# Patient Record
Sex: Female | Born: 1975 | Race: Black or African American | Hispanic: No | Marital: Married | Smoking: Never smoker
Health system: Southern US, Community
[De-identification: ages and names within clinical notes are randomized; demographics above are authoritative.]

## PROBLEM LIST (undated history)

## (undated) DIAGNOSIS — I10 Essential (primary) hypertension: Secondary | ICD-10-CM

## (undated) DIAGNOSIS — F32A Depression, unspecified: Secondary | ICD-10-CM

## (undated) DIAGNOSIS — D219 Benign neoplasm of connective and other soft tissue, unspecified: Secondary | ICD-10-CM

## (undated) DIAGNOSIS — Z531 Procedure and treatment not carried out because of patient's decision for reasons of belief and group pressure: Secondary | ICD-10-CM

## (undated) DIAGNOSIS — F329 Major depressive disorder, single episode, unspecified: Secondary | ICD-10-CM

## (undated) DIAGNOSIS — G4730 Sleep apnea, unspecified: Secondary | ICD-10-CM

## (undated) DIAGNOSIS — T7840XA Allergy, unspecified, initial encounter: Secondary | ICD-10-CM

## (undated) HISTORY — PX: MYOMECTOMY: SHX85

## (undated) HISTORY — PX: HERNIA REPAIR: SHX51

## (undated) HISTORY — DX: Depression, unspecified: F32.A

## (undated) HISTORY — PX: WISDOM TOOTH EXTRACTION: SHX21

## (undated) HISTORY — DX: Major depressive disorder, single episode, unspecified: F32.9

## (undated) HISTORY — PX: ABLATION: SHX5711

## (undated) HISTORY — PX: ABDOMINAL HYSTERECTOMY: SHX81

## (undated) HISTORY — DX: Allergy, unspecified, initial encounter: T78.40XA

## (undated) HISTORY — DX: Benign neoplasm of connective and other soft tissue, unspecified: D21.9

---

## 1997-12-25 ENCOUNTER — Other Ambulatory Visit: Admission: RE | Admit: 1997-12-25 | Discharge: 1997-12-25 | Payer: Self-pay | Admitting: Obstetrics and Gynecology

## 1998-05-26 ENCOUNTER — Encounter: Payer: Self-pay | Admitting: Gynecology

## 1998-05-26 ENCOUNTER — Ambulatory Visit (HOSPITAL_COMMUNITY): Admission: RE | Admit: 1998-05-26 | Discharge: 1998-05-26 | Payer: Self-pay | Admitting: Gynecology

## 1998-09-24 ENCOUNTER — Ambulatory Visit (HOSPITAL_COMMUNITY): Admission: RE | Admit: 1998-09-24 | Discharge: 1998-09-24 | Payer: Self-pay | Admitting: Gynecology

## 1998-09-24 ENCOUNTER — Encounter: Payer: Self-pay | Admitting: Gynecology

## 1999-03-04 ENCOUNTER — Other Ambulatory Visit: Admission: RE | Admit: 1999-03-04 | Discharge: 1999-03-04 | Payer: Self-pay | Admitting: Gynecology

## 2003-08-07 ENCOUNTER — Other Ambulatory Visit: Admission: RE | Admit: 2003-08-07 | Discharge: 2003-08-07 | Payer: Self-pay | Admitting: Gynecology

## 2004-08-08 ENCOUNTER — Other Ambulatory Visit: Admission: RE | Admit: 2004-08-08 | Discharge: 2004-08-08 | Payer: Self-pay | Admitting: Gynecology

## 2005-08-26 ENCOUNTER — Other Ambulatory Visit: Admission: RE | Admit: 2005-08-26 | Discharge: 2005-08-26 | Payer: Self-pay | Admitting: Gynecology

## 2006-12-22 ENCOUNTER — Other Ambulatory Visit: Admission: RE | Admit: 2006-12-22 | Discharge: 2006-12-22 | Payer: Self-pay | Admitting: Gynecology

## 2007-01-12 ENCOUNTER — Ambulatory Visit (HOSPITAL_COMMUNITY): Admission: RE | Admit: 2007-01-12 | Discharge: 2007-01-12 | Payer: Self-pay | Admitting: Gynecology

## 2008-05-23 ENCOUNTER — Other Ambulatory Visit: Admission: RE | Admit: 2008-05-23 | Discharge: 2008-05-23 | Payer: Self-pay | Admitting: Gynecology

## 2008-11-24 ENCOUNTER — Emergency Department (HOSPITAL_COMMUNITY): Admission: EM | Admit: 2008-11-24 | Discharge: 2008-11-24 | Payer: Self-pay | Admitting: Emergency Medicine

## 2009-08-17 ENCOUNTER — Emergency Department (HOSPITAL_COMMUNITY): Admission: EM | Admit: 2009-08-17 | Discharge: 2009-08-17 | Payer: Self-pay | Admitting: Emergency Medicine

## 2010-09-21 ENCOUNTER — Encounter
Admission: RE | Admit: 2010-09-21 | Discharge: 2010-09-21 | Payer: Self-pay | Source: Home / Self Care | Attending: Family Medicine | Admitting: Family Medicine

## 2010-10-09 ENCOUNTER — Encounter: Admit: 2010-10-09 | Payer: Self-pay | Admitting: Family Medicine

## 2011-04-17 ENCOUNTER — Other Ambulatory Visit: Payer: Self-pay | Admitting: Obstetrics & Gynecology

## 2011-04-17 ENCOUNTER — Ambulatory Visit
Admission: RE | Admit: 2011-04-17 | Discharge: 2011-04-17 | Disposition: A | Discharge status: Home / Self Care | Payer: 59 | Source: Ambulatory Visit | Attending: Obstetrics & Gynecology | Admitting: Obstetrics & Gynecology

## 2011-04-17 DIAGNOSIS — N920 Excessive and frequent menstruation with regular cycle: Secondary | ICD-10-CM

## 2011-04-17 DIAGNOSIS — D259 Leiomyoma of uterus, unspecified: Secondary | ICD-10-CM

## 2011-04-17 MED ORDER — GADOBENATE DIMEGLUMINE 529 MG/ML IV SOLN
16.0000 mL | Freq: Once | INTRAVENOUS | Status: AC | PRN
  Administered 2011-04-17: 16 mL via INTRAVENOUS

## 2011-04-21 ENCOUNTER — Other Ambulatory Visit: Payer: Self-pay | Admitting: Obstetrics & Gynecology

## 2011-04-21 ENCOUNTER — Encounter (HOSPITAL_COMMUNITY): Payer: 59

## 2011-04-21 LAB — CBC
HCT: 37.8 % (ref 36.0–46.0)
Hemoglobin: 12.2 g/dL (ref 12.0–15.0)
MCH: 28.8 pg (ref 26.0–34.0)
MCHC: 32.3 g/dL (ref 30.0–36.0)
RDW: 13.5 % (ref 11.5–15.5)

## 2011-04-23 HISTORY — PX: OTHER SURGICAL HISTORY: SHX169

## 2011-04-25 ENCOUNTER — Other Ambulatory Visit: Payer: Self-pay | Admitting: Obstetrics & Gynecology

## 2011-04-25 ENCOUNTER — Ambulatory Visit (HOSPITAL_COMMUNITY)
Admission: RE | Admit: 2011-04-25 | Discharge: 2011-04-26 | Disposition: A | Discharge status: Home / Self Care | Payer: 59 | Source: Ambulatory Visit | Attending: Obstetrics & Gynecology | Admitting: Obstetrics & Gynecology

## 2011-04-25 DIAGNOSIS — Z79899 Other long term (current) drug therapy: Secondary | ICD-10-CM | POA: Insufficient documentation

## 2011-04-25 DIAGNOSIS — Z01812 Encounter for preprocedural laboratory examination: Secondary | ICD-10-CM | POA: Insufficient documentation

## 2011-04-25 DIAGNOSIS — N949 Unspecified condition associated with female genital organs and menstrual cycle: Secondary | ICD-10-CM | POA: Insufficient documentation

## 2011-04-25 DIAGNOSIS — I10 Essential (primary) hypertension: Secondary | ICD-10-CM | POA: Insufficient documentation

## 2011-04-25 DIAGNOSIS — D259 Leiomyoma of uterus, unspecified: Secondary | ICD-10-CM | POA: Insufficient documentation

## 2011-04-25 DIAGNOSIS — N920 Excessive and frequent menstruation with regular cycle: Secondary | ICD-10-CM | POA: Insufficient documentation

## 2011-04-26 LAB — CBC
HCT: 24.8 % — ABNORMAL LOW (ref 36.0–46.0)
MCH: 29.4 pg (ref 26.0–34.0)
MCV: 88.9 fL (ref 78.0–100.0)
RBC: 2.79 MIL/uL — ABNORMAL LOW (ref 3.87–5.11)
WBC: 9.1 10*3/uL (ref 4.0–10.5)

## 2011-09-23 DIAGNOSIS — Z531 Procedure and treatment not carried out because of patient's decision for reasons of belief and group pressure: Secondary | ICD-10-CM

## 2011-09-23 DIAGNOSIS — IMO0001 Reserved for inherently not codable concepts without codable children: Secondary | ICD-10-CM

## 2011-09-23 DIAGNOSIS — D219 Benign neoplasm of connective and other soft tissue, unspecified: Secondary | ICD-10-CM

## 2011-09-23 HISTORY — DX: Benign neoplasm of connective and other soft tissue, unspecified: D21.9

## 2011-09-23 HISTORY — DX: Procedure and treatment not carried out because of patient's decision for reasons of belief and group pressure: Z53.1

## 2011-09-23 HISTORY — DX: Reserved for inherently not codable concepts without codable children: IMO0001

## 2012-03-23 ENCOUNTER — Encounter (HOSPITAL_COMMUNITY): Payer: Self-pay | Admitting: *Deleted

## 2012-04-05 ENCOUNTER — Encounter (HOSPITAL_COMMUNITY): Payer: Self-pay | Admitting: Pharmacist

## 2012-04-08 ENCOUNTER — Other Ambulatory Visit: Payer: Self-pay | Admitting: Obstetrics & Gynecology

## 2012-04-13 ENCOUNTER — Ambulatory Visit (HOSPITAL_COMMUNITY): Payer: 59 | Admitting: Anesthesiology

## 2012-04-13 ENCOUNTER — Encounter (HOSPITAL_COMMUNITY): Payer: Self-pay | Admitting: Anesthesiology

## 2012-04-13 ENCOUNTER — Ambulatory Visit (HOSPITAL_COMMUNITY)
Admission: RE | Admit: 2012-04-13 | Discharge: 2012-04-13 | Disposition: A | Discharge status: Home / Self Care | Payer: 59 | Source: Ambulatory Visit | Attending: Obstetrics & Gynecology | Admitting: Obstetrics & Gynecology

## 2012-04-13 ENCOUNTER — Encounter (HOSPITAL_COMMUNITY)
Admission: RE | Disposition: A | Discharge status: Home / Self Care | Payer: Self-pay | Source: Ambulatory Visit | Attending: Obstetrics & Gynecology

## 2012-04-13 ENCOUNTER — Encounter (HOSPITAL_COMMUNITY): Payer: Self-pay | Admitting: *Deleted

## 2012-04-13 DIAGNOSIS — N920 Excessive and frequent menstruation with regular cycle: Secondary | ICD-10-CM | POA: Insufficient documentation

## 2012-04-13 DIAGNOSIS — I10 Essential (primary) hypertension: Secondary | ICD-10-CM | POA: Insufficient documentation

## 2012-04-13 DIAGNOSIS — D259 Leiomyoma of uterus, unspecified: Secondary | ICD-10-CM | POA: Insufficient documentation

## 2012-04-13 HISTORY — DX: Benign neoplasm of connective and other soft tissue, unspecified: D21.9

## 2012-04-13 HISTORY — DX: Essential (primary) hypertension: I10

## 2012-04-13 HISTORY — DX: Procedure and treatment not carried out because of patient's decision for reasons of belief and group pressure: Z53.1

## 2012-04-13 LAB — CBC
HCT: 41.4 % (ref 36.0–46.0)
Hemoglobin: 13.4 g/dL (ref 12.0–15.0)
MCH: 29.5 pg (ref 26.0–34.0)
MCHC: 32.4 g/dL (ref 30.0–36.0)
MCV: 91 fL (ref 78.0–100.0)

## 2012-04-13 SURGERY — DILATATION & CURETTAGE/HYSTEROSCOPY WITH NOVASURE ABLATION
Anesthesia: General | Site: Uterus | Wound class: Clean Contaminated

## 2012-04-13 MED ORDER — FENTANYL CITRATE 0.05 MG/ML IJ SOLN
INTRAMUSCULAR | Status: AC
  Filled 2012-04-13: qty 2

## 2012-04-13 MED ORDER — DEXAMETHASONE SODIUM PHOSPHATE 10 MG/ML IJ SOLN
INTRAMUSCULAR | Status: AC
  Filled 2012-04-13: qty 1

## 2012-04-13 MED ORDER — KETOROLAC TROMETHAMINE 30 MG/ML IJ SOLN: 15.0000 mg | Freq: Once | INTRAMUSCULAR | Status: DC | PRN

## 2012-04-13 MED ORDER — MIDAZOLAM HCL 5 MG/5ML IJ SOLN
INTRAMUSCULAR | Status: DC | PRN
  Administered 2012-04-13: 2 mg via INTRAVENOUS

## 2012-04-13 MED ORDER — LIDOCAINE HCL (CARDIAC) 20 MG/ML IV SOLN
INTRAVENOUS | Status: AC
  Filled 2012-04-13: qty 5

## 2012-04-13 MED ORDER — CHLOROPROCAINE HCL 1 % IJ SOLN
INTRAMUSCULAR | Status: DC | PRN
  Administered 2012-04-13: 20 mL

## 2012-04-13 MED ORDER — SILVER NITRATE-POT NITRATE 75-25 % EX MISC
CUTANEOUS | Status: DC | PRN
  Administered 2012-04-13: 1

## 2012-04-13 MED ORDER — FENTANYL CITRATE 0.05 MG/ML IJ SOLN
INTRAMUSCULAR | Status: DC | PRN
  Administered 2012-04-13: 100 ug via INTRAVENOUS

## 2012-04-13 MED ORDER — LACTATED RINGERS IV SOLN
INTRAVENOUS | Status: DC | PRN
  Administered 2012-04-13: 3000 mL via INTRAUTERINE

## 2012-04-13 MED ORDER — KETOROLAC TROMETHAMINE 30 MG/ML IJ SOLN
INTRAMUSCULAR | Status: AC
  Filled 2012-04-13: qty 1

## 2012-04-13 MED ORDER — LIDOCAINE HCL (CARDIAC) 20 MG/ML IV SOLN
INTRAVENOUS | Status: DC | PRN
  Administered 2012-04-13: 30 mg via INTRAVENOUS

## 2012-04-13 MED ORDER — LACTATED RINGERS IV SOLN
INTRAVENOUS | Status: DC
  Administered 2012-04-13 (×2): via INTRAVENOUS

## 2012-04-13 MED ORDER — PROPOFOL 10 MG/ML IV EMUL
INTRAVENOUS | Status: DC | PRN
  Administered 2012-04-13: 160 mg via INTRAVENOUS
  Administered 2012-04-13: 40 mg via INTRAVENOUS

## 2012-04-13 MED ORDER — MIDAZOLAM HCL 2 MG/2ML IJ SOLN
INTRAMUSCULAR | Status: AC
  Filled 2012-04-13: qty 2

## 2012-04-13 MED ORDER — SILVER NITRATE-POT NITRATE 75-25 % EX MISC
CUTANEOUS | Status: AC
  Filled 2012-04-13: qty 2

## 2012-04-13 MED ORDER — ONDANSETRON HCL 4 MG/2ML IJ SOLN
INTRAMUSCULAR | Status: AC
  Filled 2012-04-13: qty 2

## 2012-04-13 MED ORDER — DEXAMETHASONE SODIUM PHOSPHATE 10 MG/ML IJ SOLN
INTRAMUSCULAR | Status: DC | PRN
  Administered 2012-04-13: 10 mg via INTRAVENOUS

## 2012-04-13 MED ORDER — KETOROLAC TROMETHAMINE 30 MG/ML IJ SOLN
INTRAMUSCULAR | Status: DC | PRN
  Administered 2012-04-13: 30 mg via INTRAVENOUS

## 2012-04-13 MED ORDER — ONDANSETRON HCL 4 MG/2ML IJ SOLN
INTRAMUSCULAR | Status: DC | PRN
  Administered 2012-04-13: 4 mg via INTRAVENOUS

## 2012-04-13 MED ORDER — FENTANYL CITRATE 0.05 MG/ML IJ SOLN
25.0000 ug | INTRAMUSCULAR | Status: DC | PRN
  Administered 2012-04-13 (×2): 50 ug via INTRAVENOUS

## 2012-04-13 MED ORDER — CHLOROPROCAINE HCL 1 % IJ SOLN
INTRAMUSCULAR | Status: AC
  Filled 2012-04-13: qty 30

## 2012-04-13 MED ORDER — OXYCODONE-ACETAMINOPHEN 7.5-325 MG PO TABS: 1.0000 | ORAL_TABLET | ORAL | Status: AC | PRN

## 2012-04-13 MED ORDER — PROPOFOL 10 MG/ML IV EMUL
INTRAVENOUS | Status: AC
  Filled 2012-04-13: qty 20

## 2012-04-13 SURGICAL SUPPLY — 12 items
ABLATOR ENDOMETRIAL BIPOLAR (ABLATOR) ×2 IMPLANT
CATH ROBINSON RED A/P 16FR (CATHETERS) ×2 IMPLANT
CLOTH BEACON ORANGE TIMEOUT ST (SAFETY) ×2 IMPLANT
CONTAINER PREFILL 10% NBF 60ML (FORM) IMPLANT
GLOVE BIO SURGEON STRL SZ 6.5 (GLOVE) ×4 IMPLANT
GLOVE BIOGEL PI IND STRL 7.0 (GLOVE) ×1 IMPLANT
GLOVE BIOGEL PI INDICATOR 7.0 (GLOVE) ×1
GOWN PREVENTION PLUS LG XLONG (DISPOSABLE) ×4 IMPLANT
GOWN STRL REIN XL XLG (GOWN DISPOSABLE) ×2 IMPLANT
PACK HYSTEROSCOPY LF (CUSTOM PROCEDURE TRAY) ×2 IMPLANT
TOWEL OR 17X24 6PK STRL BLUE (TOWEL DISPOSABLE) ×4 IMPLANT
WATER STERILE IRR 1000ML POUR (IV SOLUTION) ×2 IMPLANT

## 2012-04-13 NOTE — Discharge Summary (Signed)
  Physician Discharge Summary  Patient ID: Rachel Oconnor MRN: [id number] DOB/AGE: 1976-02-18 36 y.o.  Admit date: 04/13/2012 Discharge date: 04/13/2012  Admission Diagnoses: Resistent menometrorrhagia, Fibroids  Discharge Diagnoses: Resistent menometrorrhagia, Fibroids        Active Problems:  * No active hospital problems. *    Discharged Condition: good  Hospital Course:   Consults: None  Treatments: surgery: HSC, D+C, Novasure Endometrial Ablation  Disposition: 01-Home or Self Care   Medication List  As of 04/13/2012 10:48 AM   ASK your doctor about these medications         norethindrone 5 MG tablet   Commonly known as: AYGESTIN   Take 5 mg by mouth daily.           Follow-up Information    Follow up with Shaniqwa Horsman,MARIE-LYNE, MD in 3 weeks.   Contact information:   [address] [place] [address] [phone]          SignedGenia Del, MD 04/13/2012, 10:48 AM

## 2012-04-13 NOTE — Anesthesia Postprocedure Evaluation (Signed)
Anesthesia Post Note  Patient: Rachel Oconnor  Procedure(s) Performed: Procedure(s) (LRB): DILATATION & CURETTAGE/HYSTEROSCOPY WITH NOVASURE ABLATION (N/A)  Anesthesia type: General  Patient location: PACU  Post pain: Pain level controlled  Post assessment: Post-op Vital signs reviewed  Last Vitals:  Filed Vitals:   04/13/12 1145  BP: 154/84  Pulse: 59  Temp:   Resp: 16    Post vital signs: Reviewed  Level of consciousness: sedated  Complications: No apparent anesthesia complications

## 2012-04-13 NOTE — Anesthesia Preprocedure Evaluation (Signed)
Anesthesia Evaluation  Patient identified by MRN, date of birth, ID band Patient awake    Reviewed: Allergy & Precautions, H&P , NPO status , Patient's Chart, lab work & pertinent test results, reviewed documented beta blocker date and time   History of Anesthesia Complications Negative for: history of anesthetic complications  Airway Mallampati: I TM Distance: >3 FB Neck ROM: full    Dental  (+) Teeth Intact   Pulmonary neg pulmonary ROS,  breath sounds clear to auscultation  Pulmonary exam normal       Cardiovascular Exercise Tolerance: Good hypertension (patient states vitamins normally keep her BP under control, but hasn't taken them in 2 weeks.  BP 130s/90s in past year.), Rhythm:regular Rate:Normal     Neuro/Psych negative neurological ROS  negative psych ROS   GI/Hepatic negative GI ROS, Neg liver ROS,   Endo/Other  negative endocrine ROS  Renal/GU negative Renal ROS  Female GU complaint     Musculoskeletal   Abdominal   Peds  Hematology negative hematology ROS (+)   Anesthesia Other Findings   Reproductive/Obstetrics negative OB ROS                           Anesthesia Physical Anesthesia Plan  ASA: II  Anesthesia Plan: General LMA   Post-op Pain Management:    Induction:   Airway Management Planned:   Additional Equipment:   Intra-op Plan:   Post-operative Plan:   Informed Consent: I have reviewed the patients History and Physical, chart, labs and discussed the procedure including the risks, benefits and alternatives for the proposed anesthesia with the patient or authorized representative who has indicated his/her understanding and acceptance.   Dental Advisory Given  Plan Discussed with: CRNA and Surgeon  Anesthesia Plan Comments:         Anesthesia Quick Evaluation

## 2012-04-13 NOTE — Progress Notes (Signed)
Informed Dr. Rodman Pickle regarding pt's blood pressure.. DR. Rodman Pickle assessed patient and to proceed with surgery.

## 2012-04-13 NOTE — Transfer of Care (Signed)
Immediate Anesthesia Transfer of Care Note  Patient: Rachel Oconnor  Procedure(s) Performed: Procedure(s) (LRB): DILATATION & CURETTAGE/HYSTEROSCOPY WITH NOVASURE ABLATION (N/A)  Patient Location: PACU  Anesthesia Type: General  Level of Consciousness: awake  Airway & Oxygen Therapy: Patient Spontanous Breathing and Patient connected to nasal cannula oxygen  Post-op Assessment: Report given to PACU RN and Post -op Vital signs reviewed and stable  Post vital signs: stable  Complications: No apparent anesthesia complications

## 2012-04-13 NOTE — H&P (Signed)
Rachel Oconnor is an 36 y.o. female G0  RP:  Resistant menometrorrhagia  Pertinent Gynecological History: Menses: flow is excessive with use of many pads or tampons on heaviest days Bleeding: intermenstrual bleeding Contraception: Depo-Provera injections Blood transfusions: none Sexually transmitted diseases: no past history Previous GYN Procedures: Engineer, building services myomectomy Last mammogram: N/A  Last pap: normal   Menstrual History:  No LMP recorded.    Past Medical History  Diagnosis Date  . Fibroids 2013  . Refusal of blood transfusions as patient is Jehovah's Witness 2013  . Hypertension     no meds..pt states up last year    Past Surgical History  Procedure Date  . Davinci myomectomy 04/2011  . Wisdom tooth extraction   . Myomectomy     History reviewed. No pertinent family history.  Social History:  reports that she has never smoked. She has never used smokeless tobacco. She reports that she drinks alcohol. She reports that she does not use illicit drugs.  Allergies:  Allergies  Allergen Reactions  . Amoxicillin     Red blotches    Prescriptions prior to admission  Medication Sig Dispense Refill  . norethindrone (AYGESTIN) 5 MG tablet Take 5 mg by mouth daily.        Pulse 66, temperature 98.4 F (36.9 C), temperature source Oral, resp. rate 18, height 5\' 7"  (1.702 m), weight 84.823 kg (187 lb), SpO2 100.00%.  [place] Small IM myoma.  IU cavity wnl, at 4.1 mm.  Ovaries wnl.  Results for orders placed during the hospital encounter of 04/13/12 (from the past 24 hour(s))  PREGNANCY, URINE     Status: Normal   Collection Time   04/13/12  7:30 AM      Component Value Range   Preg Test, Ur NEGATIVE  NEGATIVE  CBC     Status: Normal   Collection Time   04/13/12  8:46 AM      Component Value Range   WBC 4.2  4.0 - 10.5 K/uL   RBC 4.55  3.87 - 5.11 MIL/uL   Hemoglobin 13.4  12.0 - 15.0 g/dL   HCT 16.1  09.6 - 04.5 %   MCV 91.0  78.0 - 100.0 fL   MCH 29.5  26.0  - 34.0 pg   MCHC 32.4  30.0 - 36.0 g/dL   RDW 40.9  81.1 - 91.4 %   Platelets 305  150 - 400 K/uL    No results found.  Assessment/Plan: Resistant menometrorrhagia for HSC, D+C and Novasure endometrial ablation.  Terrilyn Tyner,MARIE-LYNE 04/13/2012, 9:54 AM

## 2012-04-13 NOTE — Op Note (Signed)
04/13/2012  10:36 AM  PATIENT:  Rachel Oconnor  36 y.o. female  PRE-OPERATIVE DIAGNOSIS:  Resistant menometrorrhagia, Fibroids  POST-OPERATIVE DIAGNOSIS:  Resistant menometrorrhagia, Fibroids  PROCEDURE:  Procedure(s): DILATATION & CURETTAGE/HYSTEROSCOPY WITH NOVASURE ENDOMETRIAL ABLATION  SURGEON:  Surgeon(s): Genia Del, MD  ASSISTANTS: none   ANESTHESIA:   general  PROCEDURE: Under general anesthesia with the laryngeal mask, the patient is in lithotomy position. She is prepped with Betadine on the suprapubic, vulvar and vaginal areas.  The bladder is catheterized. She is draped as usual. The vaginal exam reveals an anteverted uterus, normal volume, no adnexal mass. The speculum was introduced in the vagina.  The anterior lip of the cervix is grasped with a tenaculum. A paracervical block is done with Nesacaine 1% at 4 and 8:00 using a total of 20 cc.  The cervix is dilated with Hegar dilators up to #19 without difficulty. The cervical length was 5 cm and the hysterometry was 11 cm for a cavity length of 6 cm. We proceed with a diagnostic hysteroscopy, the hysteroscope is introduced and the intrauterine cavity. The overall shape of the intrauterine cavity is normal. All the very small polyps are present close to the ostia. On the left side the ostium is well seen and a picture is taken. On the right side it is partially obscured by a polyp but a picture is of taken as well. Pictures were taken of the overall intrauterine cavity all the way to the cervix.  A systematic endometrial curettage is done with a sharp curet. The curettage is done on all endometrial walls. And the specimen was sent to pathology.  We then proceed to NovaSure endometrial ablation. The instrument was introduced and the intrauterine cavity. The cavity width is calculated at 2.8 cm. The antegrade T. Catheter is passed the. The endometrial ablation is done at a power of 92 for a running neonate and 11 seconds. The  instruments is then removed. The hysteroscope was introduced and the intrauterine cavity and a picture confirming good cauterization is taken. The hysteroscope was removed. The tenaculum was removed from the anterior lip of the cervix and a silver nitrate stick is used to control minor bleeding at that level. The speculum is then removed. The patient is brought to recovery room in good and stable status.  ESTIMATED BLOOD LOSS: 25 cc   Intake/Output Summary (Last 24 hours) at 04/13/12 1036 Last data filed at 04/13/12 1030  Gross per 24 hour  Intake   1000 ml  Output    125 ml  Net    875 ml     BLOOD ADMINISTERED:none   LOCAL MEDICATIONS USED:  Nesacaine 20 cc for Paracervical block  SPECIMEN:  Scraping:  Endometrial curettings  DISPOSITION OF SPECIMEN:  PATHOLOGY  COUNTS:  YES  PLAN OF CARE: Transfer to PACU    Genia Del MD  04/13/12 at 10:37 am

## 2012-06-21 ENCOUNTER — Ambulatory Visit: Payer: 59 | Attending: Family Medicine | Admitting: Physical Therapy

## 2012-06-21 DIAGNOSIS — M25559 Pain in unspecified hip: Secondary | ICD-10-CM | POA: Insufficient documentation

## 2012-06-21 DIAGNOSIS — M2569 Stiffness of other specified joint, not elsewhere classified: Secondary | ICD-10-CM | POA: Insufficient documentation

## 2012-06-21 DIAGNOSIS — M545 Low back pain, unspecified: Secondary | ICD-10-CM | POA: Insufficient documentation

## 2012-06-21 DIAGNOSIS — IMO0001 Reserved for inherently not codable concepts without codable children: Secondary | ICD-10-CM | POA: Insufficient documentation

## 2012-07-02 ENCOUNTER — Ambulatory Visit: Payer: 59 | Attending: Family Medicine | Admitting: Physical Therapy

## 2012-07-02 DIAGNOSIS — M25559 Pain in unspecified hip: Secondary | ICD-10-CM | POA: Insufficient documentation

## 2012-07-02 DIAGNOSIS — M545 Low back pain, unspecified: Secondary | ICD-10-CM | POA: Insufficient documentation

## 2012-07-02 DIAGNOSIS — IMO0001 Reserved for inherently not codable concepts without codable children: Secondary | ICD-10-CM | POA: Insufficient documentation

## 2012-07-02 DIAGNOSIS — M2569 Stiffness of other specified joint, not elsewhere classified: Secondary | ICD-10-CM | POA: Insufficient documentation

## 2012-07-09 ENCOUNTER — Ambulatory Visit: Payer: 59 | Admitting: Physical Therapy

## 2012-07-16 ENCOUNTER — Ambulatory Visit: Payer: 59 | Admitting: Physical Therapy

## 2012-07-23 ENCOUNTER — Ambulatory Visit: Payer: 59 | Admitting: Physical Therapy

## 2012-07-28 ENCOUNTER — Ambulatory Visit: Payer: 59 | Attending: Family Medicine | Admitting: Physical Therapy

## 2012-07-28 DIAGNOSIS — IMO0001 Reserved for inherently not codable concepts without codable children: Secondary | ICD-10-CM | POA: Insufficient documentation

## 2012-07-28 DIAGNOSIS — M545 Low back pain, unspecified: Secondary | ICD-10-CM | POA: Insufficient documentation

## 2012-07-28 DIAGNOSIS — M2569 Stiffness of other specified joint, not elsewhere classified: Secondary | ICD-10-CM | POA: Insufficient documentation

## 2012-07-28 DIAGNOSIS — M25559 Pain in unspecified hip: Secondary | ICD-10-CM | POA: Insufficient documentation

## 2012-07-30 ENCOUNTER — Ambulatory Visit: Payer: 59 | Admitting: Physical Therapy

## 2012-08-01 ENCOUNTER — Ambulatory Visit (INDEPENDENT_AMBULATORY_CARE_PROVIDER_SITE_OTHER): Payer: Commercial Managed Care - PPO | Admitting: Emergency Medicine

## 2012-08-01 VITALS — BP 134/92 | HR 96 | Temp 100.4°F | Resp 16 | Ht 68.0 in | Wt 187.6 lb

## 2012-08-01 DIAGNOSIS — Z23 Encounter for immunization: Secondary | ICD-10-CM

## 2012-08-01 DIAGNOSIS — S61219A Laceration without foreign body of unspecified finger without damage to nail, initial encounter: Secondary | ICD-10-CM

## 2012-08-01 DIAGNOSIS — S61209A Unspecified open wound of unspecified finger without damage to nail, initial encounter: Secondary | ICD-10-CM

## 2012-08-01 DIAGNOSIS — S335XXA Sprain of ligaments of lumbar spine, initial encounter: Secondary | ICD-10-CM

## 2012-08-01 MED ORDER — NAPROXEN SODIUM 550 MG PO TABS: 550.0000 mg | ORAL_TABLET | Freq: Two times a day (BID) | ORAL | Status: AC

## 2012-08-01 MED ORDER — HYDROCODONE-ACETAMINOPHEN 5-325 MG PO TABS: 1.0000 | ORAL_TABLET | ORAL | Status: AC | PRN

## 2012-08-01 NOTE — Progress Notes (Signed)
[facility] [address] [place] [address] [phone]  Date:  08/01/2012   Name:  Rachel Oconnor   DOB:  1976-05-14   MRN:  [id number]  PCP:  Gweneth Dimitri, MD    Chief Complaint: Back Pain and Laceration   History of Present Illness:  Rachel Oconnor is a 36 y.o. very pleasant female patient who presents with the following:  Awoke Friday with low back pain no history of injury or overuse.  Has radiation of pain in to posterior thighs and around the front of her abdomen.  Denies weakness in legs or paresthesias.  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  No prior history of low back injury or pain.   Cut her left index finger three days ago.    There is no problem list on file for this patient.   Past Medical History  Diagnosis Date  . Fibroids 2013  . Refusal of blood transfusions as patient is Jehovah's Witness 2013  . Hypertension     no meds..pt states up last year  . Allergy   . Depression     Past Surgical History  Procedure Date  . Davinci myomectomy 04/2011  . Wisdom tooth extraction   . Myomectomy   . Hernia repair   . Ablation     History  Substance Use Topics  . Smoking status: Never Smoker   . Smokeless tobacco: Never Used  . Alcohol Use: Yes     Comment: social    Family History  Problem Relation Age of Onset  . Hypertension Mother   . Cancer Father   . Hypertension Maternal Grandmother     Allergies  Allergen Reactions  . Amoxicillin     Red blotches    Medication list has been reviewed and updated.  No current outpatient prescriptions on file prior to visit.    Review of Systems:  As per HPI, otherwise negative.    Physical Examination: Filed Vitals:   08/01/12 1339  BP: 134/92  Pulse: 96  Temp: 100.4 F (38 C)  Resp: 16   Filed Vitals:   08/01/12 1339  Height: 5\' 8"  (1.727 m)  Weight: 187 lb 9.6 oz (85.095 kg)   Body mass index is 28.52 kg/(m^2). Ideal Body  Weight: Weight in (lb) to have BMI = 25: 164.1    GEN: WDWN, NAD, Non-toxic, Alert & Oriented x 3 HEENT: Atraumatic, Normocephalic.  Ears and Nose: No external deformity. EXTR: No clubbing/cyanosis/edema NEURO: Normal gait.  PSYCH: Normally interactive. Conversant. Not depressed or anxious appearing.  Calm demeanor.  BACK:  Tender right lumbosacral area with spasm paraspinous muscles.  Neuro gross motor intact Superficial laceration left second finger NATI. No FB or infection.  Assessment and Plan: Lumbar strain Follow up if not improved or for new or worsened symptoms in one week Finger lac TDAP  Carmelina Dane, MD

## 2012-08-04 NOTE — Progress Notes (Signed)
Reviewed and agree.

## 2012-08-06 ENCOUNTER — Ambulatory Visit: Payer: 59 | Admitting: Physical Therapy

## 2012-08-13 ENCOUNTER — Ambulatory Visit: Payer: 59 | Admitting: Physical Therapy

## 2012-08-17 ENCOUNTER — Ambulatory Visit: Payer: 59 | Admitting: Physical Therapy

## 2012-09-01 ENCOUNTER — Ambulatory Visit: Payer: 59 | Attending: Family Medicine | Admitting: Physical Therapy

## 2012-09-01 DIAGNOSIS — M2569 Stiffness of other specified joint, not elsewhere classified: Secondary | ICD-10-CM | POA: Insufficient documentation

## 2012-09-01 DIAGNOSIS — M545 Low back pain, unspecified: Secondary | ICD-10-CM | POA: Insufficient documentation

## 2012-09-01 DIAGNOSIS — M25559 Pain in unspecified hip: Secondary | ICD-10-CM | POA: Insufficient documentation

## 2012-09-01 DIAGNOSIS — IMO0001 Reserved for inherently not codable concepts without codable children: Secondary | ICD-10-CM | POA: Insufficient documentation

## 2012-09-03 ENCOUNTER — Ambulatory Visit: Payer: 59 | Admitting: Physical Therapy

## 2012-09-07 ENCOUNTER — Ambulatory Visit: Payer: 59 | Admitting: Physical Therapy

## 2012-09-08 ENCOUNTER — Ambulatory Visit: Payer: 59 | Admitting: Physical Therapy

## 2012-10-11 ENCOUNTER — Ambulatory Visit (INDEPENDENT_AMBULATORY_CARE_PROVIDER_SITE_OTHER): Payer: Commercial Managed Care - PPO | Admitting: Emergency Medicine

## 2012-10-11 VITALS — BP 153/87 | HR 69 | Temp 97.9°F | Resp 16 | Ht 67.0 in | Wt 197.0 lb

## 2012-10-11 DIAGNOSIS — W57XXXA Bitten or stung by nonvenomous insect and other nonvenomous arthropods, initial encounter: Secondary | ICD-10-CM

## 2012-10-11 DIAGNOSIS — T148 Other injury of unspecified body region: Secondary | ICD-10-CM

## 2012-10-11 DIAGNOSIS — L739 Follicular disorder, unspecified: Secondary | ICD-10-CM

## 2012-10-11 DIAGNOSIS — L738 Other specified follicular disorders: Secondary | ICD-10-CM

## 2012-10-11 MED ORDER — DOXYCYCLINE HYCLATE 100 MG PO CAPS: 100.0000 mg | ORAL_CAPSULE | Freq: Two times a day (BID) | ORAL | Status: DC

## 2012-10-11 MED ORDER — TRIAMCINOLONE ACETONIDE 0.1 % EX CREA: TOPICAL_CREAM | Freq: Two times a day (BID) | CUTANEOUS | Status: DC

## 2012-10-11 MED ORDER — METHYLPREDNISOLONE ACETATE 80 MG/ML IJ SUSP
120.0000 mg | Freq: Once | INTRAMUSCULAR | Status: AC
  Administered 2012-10-11: 120 mg via INTRAMUSCULAR

## 2012-10-11 NOTE — Progress Notes (Signed)
[facility] [address], [place] [address] [phone]  Date:  10/11/2012   Name:  Rachel Oconnor   DOB:  1975-11-23   MRN:  [id number]  PCP:  Gweneth Dimitri, MD    Chief Complaint: Rash   History of Present Illness:  Rachel Oconnor is a 37 y.o. very pleasant female patient who presents with the following:  Stayed at a condo over the weekend and now has an eruption of an erythematous rash on her shoulders and left breast.  No known insect exposure.  No fever or chills. Eruption is prurititic.  There is no problem list on file for this patient.   Past Medical History  Diagnosis Date  . Fibroids 2013  . Refusal of blood transfusions as patient is Jehovah's Witness 2013  . Hypertension     no meds..pt states up last year  . Allergy   . Depression     Past Surgical History  Procedure Date  . Davinci myomectomy 04/2011  . Wisdom tooth extraction   . Myomectomy   . Hernia repair   . Ablation     History  Substance Use Topics  . Smoking status: Never Smoker   . Smokeless tobacco: Never Used  . Alcohol Use: Yes     Comment: social    Family History  Problem Relation Age of Onset  . Hypertension Mother   . Cancer Father   . Hypertension Maternal Grandmother     Allergies  Allergen Reactions  . Amoxicillin     Red blotches    Medication list has been reviewed and updated.  Current Outpatient Prescriptions on File Prior to Visit  Medication Sig Dispense Refill  . cyclobenzaprine (FLEXERIL) 10 MG tablet Take 10 mg by mouth 2 (two) times daily as needed.      Colleen Can FE 1/20 1-20 MG-MCG tablet       . meloxicam (MOBIC) 15 MG tablet Take 15 mg by mouth daily.      . naproxen sodium (ANAPROX DS) 550 MG tablet Take 1 tablet (550 mg total) by mouth 2 (two) times daily with a meal.  40 tablet  0    Review of Systems:  As per HPI, otherwise negative.    Physical Examination: Filed Vitals:   10/11/12 1927  BP: 153/87  Pulse: 69   Temp: 97.9 F (36.6 C)  Resp: 16   Filed Vitals:   10/11/12 1927  Height: 5\' 7"  (1.702 m)  Weight: 197 lb (89.359 kg)   Body mass index is 30.85 kg/(m^2). Ideal Body Weight: Weight in (lb) to have BMI = 25: 159.3    GEN: WDWN, NAD, Non-toxic, Alert & Oriented x 3 HEENT: Atraumatic, Normocephalic.  Ears and Nose: No external deformity. EXTR: No clubbing/cyanosis/edema NEURO: Normal gait.  PSYCH: Normally interactive. Conversant. Not depressed or anxious appearing.  Calm demeanor.  SKIN:  Erythematous patchy coalescent raised rash with some pustular appearing.  None excoriated.  Assessment and Plan: Bedbug bites vs folliculitis Depo medrol Doxycycline Follow up as needed  Carmelina Dane, MD

## 2012-10-11 NOTE — Patient Instructions (Signed)
Bedbugs  Bedbugs are tiny bugs that live in and around beds. During the day, they hide in mattresses and other places near beds. They come out at night and bite people lying in bed. They need blood to live and grow. Bedbugs can be found in beds anywhere. Usually, they are found in places where many people come and go (hotels, shelters, hospitals). It does not matter whether the place is dirty or clean.  Getting bitten by bedbugs rarely causes a medical problem. The biggest problem can be getting rid of them. This often takes the work of a pest control expert.  CAUSES   Less use of pesticides. Bedbugs were common before the 1950s. Then, strong pesticides such as DDT nearly wiped them out. Today, these pesticides are not used because they harm the environment and can cause health problems.   More travel. Besides mattresses, bedbugs can also live in clothing and luggage. They can come along as people travel from place to place. Bedbugs are more common in certain parts of the world. When people travel to those areas, the bugs can come home with them.   Presence of birds and bats. Bedbugs often infest birds and bats. If you have these animals in or near your home, bedbugs may infest your house, too.  SYMPTOMS  It does not hurt to be bitten by a bedbug. You will probably not wake up when you are bitten. Bedbugs usually bite areas of the skin that are not covered. Symptoms may show when you wake up, or they may take a day or more to show up. Symptoms may include:   Small red bumps on the skin. These might be lined up in a row or clustered in a group.   A darker red dot in the middle of red bumps.   Blisters on the skin. There may be swelling and very bad itching. These may be signs of an allergic reaction. This does not happen often.  DIAGNOSIS   Bedbug bites might look and feel like other types of insect bites. The bugs do not stay on the body like ticks or lice. They bite, drop off, and crawl away to hide. Your caregiver will probably:   Ask about your symptoms.   Ask about your recent activities and travel.   Check your skin for bedbug bites.   Ask you to check at home for signs of bedbugs. You should look for:   Spots or stains on the bed or nearby. This could be from bedbugs that were crushed or from their eggs or waste.   Bedbugs themselves. They are reddish-brown, oval, and flat. They do not fly. They are about the size of an apple seed.   Places to look for bedbugs include:   Beds. Check mattresses, headboards, box springs, and bed frames.   On drapes and curtains near the bed.   Under carpeting in the bedroom.   Behind electrical outlets.   Behind any wallpaper that is peeling.   Inside luggage.  TREATMENT  Most bedbug bites do not need treatment. They usually go away on their own in a few days. The bites are not dangerous. However, treatment may be needed if you have scratched so much that your skin has become infected. You may also need treatment if you are allergic to bedbug bites. Treatment options include:   A drug that stops swelling and itching (corticosteroid). Usually, a cream is rubbed on the skin. If you have a bad rash, you may be   given a corticosteroid pill.   Oral antihistamines. These are pills to help control itching.   Antibiotic medicines. An antibiotic may be prescribed for infected skin.  HOME CARE INSTRUCTIONS    Take any medicine prescribed by your caregiver for your bites. Follow the directions carefully.   Consider wearing pajamas with long sleeves and pant legs.    Your bedroom may need to be treated. A pest control expert should make sure the bedbugs are gone. You may need to throw away mattresses or luggage. Ask the pest control expert what you can do to keep the bedbugs from coming back. Common suggestions include:   Putting a plastic cover over your mattress.   Washing and drying your clothes and bedding in hot water and a hot dryer. The temperature should be hotter than 120 F (48.9 C). Bedbugs are killed by high temperatures.   Vacuuming carefully all around your bed. Vacuum in all cracks and crevices where the bugs might hide. Do this often.   Carefully checking all used furniture, bedding, or clothes that you bring into your house.   Eliminating bird nests and bat roosts.   If you get bedbug bites when traveling, check all your possessions carefully before bringing them into your house. If you find any bugs on clothes or in your luggage, consider throwing those items away.  SEEK MEDICAL CARE IF:   You have red bug bites that keep coming back.   You have red bug bites that itch badly.   You have bug bites that cause a skin rash.   You have scratch marks that are red and sore.  SEEK IMMEDIATE MEDICAL CARE IF:  You have a fever.  Document Released: 10/11/2010 Document Revised: 12/01/2011 Document Reviewed: 10/11/2010  [employer] Patient Information 2013 [employer]

## 2012-10-12 NOTE — Progress Notes (Signed)
Reviewed and agree.

## 2012-12-19 ENCOUNTER — Ambulatory Visit (INDEPENDENT_AMBULATORY_CARE_PROVIDER_SITE_OTHER): Payer: Commercial Managed Care - PPO | Admitting: Emergency Medicine

## 2012-12-19 VITALS — BP 100/70 | HR 94 | Temp 97.6°F | Resp 20 | Ht 67.0 in | Wt 180.8 lb

## 2012-12-19 DIAGNOSIS — A044 Other intestinal Escherichia coli infections: Secondary | ICD-10-CM

## 2012-12-19 DIAGNOSIS — E860 Dehydration: Secondary | ICD-10-CM

## 2012-12-19 DIAGNOSIS — R112 Nausea with vomiting, unspecified: Secondary | ICD-10-CM

## 2012-12-19 MED ORDER — ONDANSETRON 4 MG PO TBDP
4.0000 mg | ORAL_TABLET | Freq: Once | ORAL | Status: AC
  Administered 2012-12-19: 4 mg via ORAL

## 2012-12-19 MED ORDER — ONDANSETRON 8 MG PO TBDP: 8.0000 mg | ORAL_TABLET | Freq: Three times a day (TID) | ORAL | Status: DC | PRN

## 2012-12-19 NOTE — Patient Instructions (Addendum)
Clear Liquid Diet The clear liquid dietconsists of foods that are liquid or will become liquid at room temperature.You should be able to see through the liquid and beverages. Examples of foods allowed on a clear liquid diet include fruit juice, broth or bouillon, gelatin, or frozen ice pops. The purpose of this diet is to provide necessary fluid, electrolytes such as sodium and potassium, and energy to keep the body functioning during times when you are not able to consume a regular diet.A clear liquid diet should not be continued for long periods of time as it is not nutritionally adequate.  REASONS FOR USING A CLEAR LIQUID DIET  In sudden onset (acute) conditions for a patient before or after surgery.  As the first step in oral feeding.  For fluid and electrolyte replacement in diarrheal diseases.  As a diet before certain medical tests are performed. ADEQUACY The clear liquid diet is adequate only in ascorbic acid, according to the Recommended Dietary Allowances of the National Research Council. CHOOSING FOODS Breads and Starches  Allowed:  None are allowed.  Avoid: All are avoided. Vegetables  Allowed:  Strained tomato or vegetable juice.  Avoid: Any others. Fruit  Allowed:  Strained fruit juices and fruit drinks. Include 1 serving of citrus or vitamin C-enriched fruit juice daily.  Avoid: Any others. Meat and Meat Substitutes  Allowed:  None are allowed.  Avoid: All are avoided. Milk  Allowed:  None are allowed.  Avoid: All are avoided. Soups and Combination Foods  Allowed:  Clear bouillon, broth, or strained broth-based soups.  Avoid: Any others. Desserts and Sweets  Allowed:  Sugar, honey. High protein gelatin. Flavored gelatin, ices, or frozen ice pops that do not contain milk.  Avoid: Any others. Fats and Oils  Allowed:  None are allowed.  Avoid: All are avoided. Beverages  Allowed: Cereal beverages, coffee (regular or decaffeinated), tea, or soda  at the discretion of your caregiver.  Avoid: Any others. Condiments  Allowed:  Iodized salt.  Avoid: Any others, including pepper. Supplements  Allowed:  Liquid nutrition beverages.  Avoid: Any others that contain lactose or fiber. SAMPLE MEAL PLAN Breakfast  4 oz (120 mL) strained orange juice.   to 1 cup (125 to 250 mL) gelatin (plain or fortified).  1 cup (250 mL) beverage (coffee or tea).  Sugar, if desired. Midmorning Snack   cup (125 mL) gelatin (plain or fortified). Lunch  1 cup (250 mL) broth or consomm.  4 oz (120 mL) strained grapefruit juice.   cup (125 mL) gelatin (plain or fortified).  1 cup (250 mL) beverage (coffee or tea).  Sugar, if desired. Midafternoon Snack   cup (125 mL) fruit ice.   cup (125 mL) strained fruit juice. Dinner  1 cup (250 mL) broth or consomm.   cup (125 mL) cranberry juice.   cup (125 mL) flavored gelatin (plain or fortified).  1 cup (250 mL) beverage (coffee or tea).  Sugar, if desired. Evening Snack  4 oz (120 mL) strained apple juice (vitamin C-fortified).   cup (125 mL) flavored gelatin (plain or fortified). Document Released: 09/08/2005 Document Revised: 12/01/2011 Document Reviewed: 12/06/2010 ExitCare Patient Information 2013 [employer]. Viral Gastroenteritis Viral gastroenteritis is also known as stomach flu. This condition affects the stomach and intestinal tract. It can cause sudden diarrhea and vomiting. The illness typically lasts 3 to 8 days. Most people develop an immune response that eventually gets rid of the virus. While this natural response develops, the virus can make you   quite ill. CAUSES  Many different viruses can cause gastroenteritis, such as rotavirus or noroviruses. You can catch one of these viruses by consuming contaminated food or water. You may also catch a virus by sharing utensils or other personal items with an infected person or by touching a contaminated  surface. SYMPTOMS  The most common symptoms are diarrhea and vomiting. These problems can cause a severe loss of body fluids (dehydration) and a body salt (electrolyte) imbalance. Other symptoms may include:  Fever.  Headache.  Fatigue.  Abdominal pain. DIAGNOSIS  Your caregiver can usually diagnose viral gastroenteritis based on your symptoms and a physical exam. A stool sample may also be taken to test for the presence of viruses or other infections. TREATMENT  This illness typically goes away on its own. Treatments are aimed at rehydration. The most serious cases of viral gastroenteritis involve vomiting so severely that you are not able to keep fluids down. In these cases, fluids must be given through an intravenous line (IV). HOME CARE INSTRUCTIONS   Drink enough fluids to keep your urine clear or pale yellow. Drink small amounts of fluids frequently and increase the amounts as tolerated.  Ask your caregiver for specific rehydration instructions.  Avoid:  Foods high in sugar.  Alcohol.  Carbonated drinks.  Tobacco.  Juice.  Caffeine drinks.  Extremely hot or cold fluids.  Fatty, greasy foods.  Too much intake of anything at one time.  Dairy products until 24 to 48 hours after diarrhea stops.  You may consume probiotics. Probiotics are active cultures of beneficial bacteria. They may lessen the amount and number of diarrheal stools in adults. Probiotics can be found in yogurt with active cultures and in supplements.  Wash your hands well to avoid spreading the virus.  Only take over-the-counter or prescription medicines for pain, discomfort, or fever as directed by your caregiver. Do not give aspirin to children. Antidiarrheal medicines are not recommended.  Ask your caregiver if you should continue to take your regular prescribed and over-the-counter medicines.  Keep all follow-up appointments as directed by your caregiver. SEEK IMMEDIATE MEDICAL CARE IF:    You are unable to keep fluids down.  You do not urinate at least once every 6 to 8 hours.  You develop shortness of breath.  You notice blood in your stool or vomit. This may look like coffee grounds.  You have abdominal pain that increases or is concentrated in one small area (localized).  You have persistent vomiting or diarrhea.  You have a fever.  The patient is a child younger than 3 months, and he or she has a fever.  The patient is a child older than 3 months, and he or she has a fever and persistent symptoms.  The patient is a child older than 3 months, and he or she has a fever and symptoms suddenly get worse.  The patient is a baby, and he or she has no tears when crying. MAKE SURE YOU:   Understand these instructions.  Will watch your condition.  Will get help right away if you are not doing well or get worse. Document Released: 09/08/2005 Document Revised: 12/01/2011 Document Reviewed: 06/25/2011 ExitCare Patient Information 2013 [employer].  

## 2012-12-19 NOTE — Progress Notes (Signed)
[facility] [address], [place] [address] [phone]  Date:  12/19/2012   Name:  Rachel Oconnor   DOB:  20-May-1976   MRN:  [id number]  PCP:  Gweneth Dimitri, MD    Chief Complaint: URI   History of Present Illness:  LE FERRAZ is a 37 y.o. very pleasant female patient who presents with the following:  Ill since last night with diarrhea, nausea and vomiting.  Vomited three times in waiting room.  Had syncopal episode at home in the hallway and was dizzy when she arrived here.  The patient has no complaint of blood, mucous, or pus in her stools. Has a persistent upper respiratory infection.  Has a cough with a mucoid sputum.  No fever or chills.  Poor po intake.  No improvement with over the counter medications or other home remedies. Denies other complaint or health concern today.   There is no problem list on file for this patient.   Past Medical History  Diagnosis Date  . Fibroids 2013  . Refusal of blood transfusions as patient is Jehovah's Witness 2013  . Hypertension     no meds..pt states up last year  . Allergy   . Depression     Past Surgical History  Procedure Laterality Date  . Davinci myomectomy  04/2011  . Wisdom tooth extraction    . Myomectomy    . Hernia repair    . Ablation      History  Substance Use Topics  . Smoking status: Never Smoker   . Smokeless tobacco: Never Used  . Alcohol Use: Yes     Comment: social    Family History  Problem Relation Age of Onset  . Hypertension Mother   . Cancer Father   . Hypertension Maternal Grandmother     Allergies  Allergen Reactions  . Amoxicillin     Red blotches    Medication list has been reviewed and updated.  Current Outpatient Prescriptions on File Prior to Visit  Medication Sig Dispense Refill  . cyclobenzaprine (FLEXERIL) 10 MG tablet Take 10 mg by mouth 2 (two) times daily as needed.      . doxycycline (VIBRAMYCIN) 100 MG capsule Take 1 capsule (100 mg  total) by mouth 2 (two) times daily.  20 capsule  0  . JUNEL FE 1/20 1-20 MG-MCG tablet       . meloxicam (MOBIC) 15 MG tablet Take 15 mg by mouth daily.      . naproxen sodium (ANAPROX DS) 550 MG tablet Take 1 tablet (550 mg total) by mouth 2 (two) times daily with a meal.  40 tablet  0  . triamcinolone cream (KENALOG) 0.1 % Apply topically 2 (two) times daily.  30 g  0   No current facility-administered medications on file prior to visit.    Review of Systems:  As per HPI, otherwise negative.    Physical Examination: Filed Vitals:   12/19/12 1134  BP: 100/70  Pulse: 94  Temp: 97.6 F (36.4 C)  Resp: 20   Filed Vitals:   12/19/12 1134  Height: 5\' 7"  (1.702 m)  Weight: 180 lb 12.8 oz (82.01 kg)   Body mass index is 28.31 kg/(m^2). Ideal Body Weight: Weight in (lb) to have BMI = 25: 159.3  GEN: WDWN, ill appearing , Non-toxic, A & O x 3  Not icteric HEENT: Atraumatic, Normocephalic. Neck supple. No masses, No LAD.  Oropharynx dry Ears and Nose: No external  deformity. CV: RRR, No M/G/R. No JVD. No thrill. No extra heart sounds. PULM: CTA B, no wheezes, crackles, rhonchi. No retractions. No resp. distress. No accessory muscle use. ABD: S, NT, ND, +BS. No rebound. No HSM. EXTR: No c/c/e NEURO Normal gait.  PSYCH: Normally interactive. Conversant. Not depressed or anxious appearing.  Calm demeanor.    Assessment and Plan: Gastroenteritis Dehydration IV hydration Zofran Imodium Follow up as needed Signed,  Phillips Odor, MD

## 2012-12-19 NOTE — Progress Notes (Signed)
Rachel Oconnor was able to urinate and produced 4oz. Of urine, after receiving 1.5 bags of saline via IV infusion. Patient will continue receiving fluids until [facility] discontinues the order.

## 2012-12-21 NOTE — Progress Notes (Signed)
Reviewed and agree.

## 2014-12-05 LAB — HM PAP SMEAR

## 2015-07-12 ENCOUNTER — Ambulatory Visit (INDEPENDENT_AMBULATORY_CARE_PROVIDER_SITE_OTHER): Payer: No Typology Code available for payment source | Admitting: Neurology

## 2015-07-12 ENCOUNTER — Encounter: Payer: Self-pay | Admitting: Neurology

## 2015-07-12 VITALS — BP 130/90 | HR 78 | Resp 20 | Ht 67.0 in | Wt 189.0 lb

## 2015-07-12 DIAGNOSIS — R0683 Snoring: Secondary | ICD-10-CM | POA: Diagnosis not present

## 2015-07-12 DIAGNOSIS — G4730 Sleep apnea, unspecified: Secondary | ICD-10-CM | POA: Diagnosis not present

## 2015-07-12 NOTE — Patient Instructions (Signed)

## 2015-07-12 NOTE — Progress Notes (Signed)
SLEEP MEDICINE CLINIC   Provider:  Larey Seat, M D  Referring Provider: [facility] Primary Care Physician:  Cari Caraway, MD  Chief Complaint  Patient presents with  . New Patient (Initial Visit)    snoring, rm 11, alone    HPI:  Rachel Oconnor is a married  Afro-american , right handed  39 y.o. female , seen here as a referral from [facility]. [facility] for a sleep apnea evaluation   Chief complaint according to patient : Rachel Oconnor reports that her husband has more than once made her aware that she snores and also breezes irregularly at night. Initially she was not inclined to believe that he recorded her. She brought this experience also to her dentist and she filled a question here at the office of [facility]. She has no significant past medical history she endorsed sinus trouble some arthritis painful and swollen joints, hearing trouble, and weight gain over the recent past. She also stated that she has been snoring on a regular basis, this based on her husband's report. She feels tired and fatigued frequently she has A. fib X tensive family medical history for sleep apnea she states both parents all her aunts and uncles has been on CPAP machines. Listening to her husband's recording of her sleeping she is aware that she also gasps for air stops breathing at night. At her dentist's office she endorsed the Epworth sleepiness score at 15 points which is elevated.  Sleep habits are as follows: She goes to bed between 11:30 and midnight, she falls asleep very promptly. The bedroom that she shares with her husband is cool, quiet and dark. She sleeps on one single pillow and she usually sleeps either on her left side or on her back. She has also noticed that at times her jaw was tight in the morning and suspects she may have bruxism. She rises in the morning at about 5:30 AM. Her husband leave to go to work earlier than her and she usually goes back to sleep  for 2 hours after he left. She states that bursitis in her right hip keeps her from sleeping on her right side. In addition her sleep is interrupted by diaphoresis she sweats profusely at night. She has also noticed that drinking a glass of red wine makes the sweating worse. Overall she will have about 7-8 hours of sleep at night and she does not have to go to the bathroom. She drinks coffee about 3 mornings out of the week mostly she drinks water. She works as a Chartered certified accountant for a [employer] the closest or she has to visit is 55 minutes from her home the furthest away 3-1/2 hours. She is on the road a lot. She is on the road Tuesday through Friday. After her actual work hours she works from home. She is also on call for her company.  Sleep medical history and family sleep history: She gained about 25 pounds over the last 2 years, she endorses an extensive family history positive for obstructive sleep apnea many family members on her maternal and paternal family side are using CPAP.  Social history: Married, full time gainfully employed, nonsmoker, red wine drinker, 1-5 glasses a week.   Review of Systems: Out of a complete 14 system review, the patient complains of only the following symptoms, and all other reviewed systems are negative.   Epworth score  11, Fatigue severity score 42  , depression score see  below.    Social History   Social History  . Marital Status: Married    Spouse Name: N/A  . Number of Children: N/A  . Years of Education: N/A   Occupational History  . Not on file.   Social History Main Topics  . Smoking status: Never Smoker   . Smokeless tobacco: Never Used  . Alcohol Use: Yes     Comment: social  . Drug Use: No  . Sexual Activity: Yes   Other Topics Concern  . Not on file   Social History Narrative    Family History  Problem Relation Age of Onset  . Hypertension Mother   . Cancer Father   . Hypertension Maternal Grandmother      Past Medical History  Diagnosis Date  . Fibroids 2013  . Refusal of blood transfusions as patient is Jehovah's Witness 2013  . Hypertension     no meds..pt states up last year  . Allergy   . Depression     Past Surgical History  Procedure Laterality Date  . Davinci myomectomy  04/2011  . Wisdom tooth extraction    . Myomectomy    . Hernia repair    . Ablation      No current outpatient prescriptions on file.   No current facility-administered medications for this visit.    Allergies as of 07/12/2015 - Review Complete 07/12/2015  Allergen Reaction Noted  . Amoxicillin  03/23/2012    Vitals: BP 130/90 mmHg  Pulse 78  Resp 20  Ht 5\' 7"  (1.702 m)  Wt 189 lb (85.73 kg)  BMI 29.59 kg/m2 Last Weight:  Wt Readings from Last 1 Encounters:  07/12/15 189 lb (85.73 kg)   NIO:EVOJ mass index is 29.59 kg/(m^2).     Last Height:   Ht Readings from Last 1 Encounters:  07/12/15 5\' 7"  (1.702 m)    Physical exam:  General: The patient is awake, alert and appears not in acute distress. The patient is well groomed. Head: Normocephalic, atraumatic. Neck is supple. Mallampati 3,  neck circumference: 15.25 . Nasal airflow unrestricted, TMJ is evident . Retrognathia is not seen.  Cardiovascular:  Regular rate and rhythm , without  murmurs or carotid bruit, and without distended neck veins. Respiratory: Lungs are clear to auscultation. Skin:  Without evidence of edema, or rash Trunk: BMI is 30.  Neurologic exam : The patient is awake and alert, oriented to place and time.   Memory subjective  described as intact.     Attention span & concentration ability appears normal.  Speech is fluent, without dysarthria, dysphonia or aphasia.  Mood and affect are appropriate.  Cranial nerves: Pupils are equal and briskly reactive to light.  Funduscopic exam without evidence of pallor or edema. Extraocular movements  in vertical and horizontal planes intact and without nystagmus.  Visual fields by finger perimetry are intact. Hearing to finger rub intact.   Facial sensation intact to fine touch.  Facial motor strength is symmetric and tongue and uvula move midline. Shoulder shrug was symmetrical.   Motor exam:  Normal tone, muscle bulk and symmetric strength in all extremities.  Sensory:  Fine touch, pinprick and vibration were tested in all extremities. Proprioception tested in the upper extremities was normal.  Coordination: Rapid alternating movements in the fingers/hands was normal.  Finger-to-nose maneuver  normal without evidence of ataxia, dysmetria or tremor.  Gait and station: Patient walks without assistive device and is able unassisted to climb up to the exam table.  Strength within normal limits. Stance is stable and normal.    Deep tendon reflexes: in the upper and lower extremities are symmetric and intact. Babinski maneuver response is downgoing.  The patient was advised of the nature of the diagnosed sleep disorder , the treatment options and risks for general a health and wellness arising from not treating the condition.  I spent more than 30 minutes of face to face time with the patient. Greater than 50% of time was spent in counseling and coordination of care. We have discussed the diagnosis and differential and I answered the patient's questions.     Assessment:  After physical and neurologic examination, review of laboratory studies,  Personal review of imaging studies, reports of other /same  Imaging studies,  Results of polysomnography/ neurophysiology testing and pre-existing records as far as provided in visit., my assessment is   1) Mrs. Oconnor's diagnosis is very likely obstructive sleep apnea. Based on her husband's observation and tape recorder she is snoring with interruptions in her sleep breathing pattern. The patient also has a strong family history. She is a slightly elevated body mass index but gained this weight over a very short  period of time in the last 2 years about 25 pounds this is most likely her most significant risk factor. She does not have significant retrognathia, upper airway is not restricted, she doesn't have nasal congestion at this time.  Plan:  Treatment plan and additional workup :  I would like for this patient to undergo a home sleep test a device that she can take home but we'll measure her breathing rhythm the movement of chest wall and abdominal wall , pulse oximetry and pulse rate. Based on the home sleep test we can make a decision if her apnea is severe enough to require CPAP treatment, or if a dental device is indicated. The decision is not just based on the severity of apnea but also on comorbidities such as prolonged hypoxemic periods during sleep, tachybradycardia arrhythmia during sleep, and in general REM sleep dependent apnea does not respond as well to dental devices.     Asencion Partridge Shiquan Mathieu MD  07/12/2015  DENTISTRY , [facility]   CC: [facility], [place], [place] [address]   Cc Dr. Theadore Nan, [place]ie [place]er

## 2016-03-24 ENCOUNTER — Encounter (INDEPENDENT_AMBULATORY_CARE_PROVIDER_SITE_OTHER): Payer: No Typology Code available for payment source | Admitting: Neurology

## 2016-03-24 DIAGNOSIS — R0683 Snoring: Secondary | ICD-10-CM

## 2016-03-24 DIAGNOSIS — G4733 Obstructive sleep apnea (adult) (pediatric): Secondary | ICD-10-CM | POA: Diagnosis not present

## 2016-03-24 DIAGNOSIS — G4730 Sleep apnea, unspecified: Secondary | ICD-10-CM

## 2016-04-10 ENCOUNTER — Telehealth: Payer: Self-pay

## 2016-04-10 DIAGNOSIS — R0683 Snoring: Secondary | ICD-10-CM

## 2016-04-10 NOTE — Telephone Encounter (Signed)
I spoke to pt and advised her that her HST does not reveal evidence for significant sleep apnea. Dr. Brett Fairy recommends weight loss, and avoiding supine sleep. Since pt continues to snore, a dental device is indicated. Pt is agreeable to a dental device. Pt says she thinks that [facility] do dental devices and wishes for the referral to be sent there. Pt verbalized understanding of results. Pt had no questions at this time but was encouraged to call back if questions arise.

## 2016-09-24 ENCOUNTER — Other Ambulatory Visit: Payer: Self-pay | Admitting: Family Medicine

## 2016-09-24 DIAGNOSIS — Z1231 Encounter for screening mammogram for malignant neoplasm of breast: Secondary | ICD-10-CM

## 2016-10-10 ENCOUNTER — Ambulatory Visit: Payer: Self-pay

## 2016-12-04 ENCOUNTER — Ambulatory Visit (INDEPENDENT_AMBULATORY_CARE_PROVIDER_SITE_OTHER): Payer: Self-pay | Admitting: Family Medicine

## 2016-12-04 ENCOUNTER — Encounter: Payer: Self-pay | Admitting: Family Medicine

## 2016-12-04 VITALS — BP 126/90 | HR 74 | Temp 98.2°F | Ht 67.0 in | Wt 197.8 lb

## 2016-12-04 DIAGNOSIS — M545 Low back pain, unspecified: Secondary | ICD-10-CM

## 2016-12-04 DIAGNOSIS — Z13220 Encounter for screening for lipoid disorders: Secondary | ICD-10-CM | POA: Diagnosis not present

## 2016-12-04 DIAGNOSIS — Z1239 Encounter for other screening for malignant neoplasm of breast: Secondary | ICD-10-CM

## 2016-12-04 DIAGNOSIS — Z23 Encounter for immunization: Secondary | ICD-10-CM | POA: Diagnosis not present

## 2016-12-04 DIAGNOSIS — Z1231 Encounter for screening mammogram for malignant neoplasm of breast: Secondary | ICD-10-CM | POA: Diagnosis not present

## 2016-12-04 DIAGNOSIS — L83 Acanthosis nigricans: Secondary | ICD-10-CM

## 2016-12-04 DIAGNOSIS — R030 Elevated blood-pressure reading, without diagnosis of hypertension: Secondary | ICD-10-CM | POA: Diagnosis not present

## 2016-12-04 LAB — COMPREHENSIVE METABOLIC PANEL
ALBUMIN: 4.6 g/dL (ref 3.5–5.2)
ALK PHOS: 42 U/L (ref 39–117)
ALT: 12 U/L (ref 0–35)
AST: 14 U/L (ref 0–37)
BILIRUBIN TOTAL: 0.6 mg/dL (ref 0.2–1.2)
BUN: 13 mg/dL (ref 6–23)
CALCIUM: 9.8 mg/dL (ref 8.4–10.5)
CO2: 23 mEq/L (ref 19–32)
CREATININE: 0.76 mg/dL (ref 0.40–1.20)
Chloride: 106 mEq/L (ref 96–112)
GFR: 107.89 mL/min (ref >60.00)
Glucose, Bld: 88 mg/dL (ref 70–99)
Potassium: 4.4 mEq/L (ref 3.5–5.1)
Sodium: 138 mEq/L (ref 135–145)
Total Protein: 7.6 g/dL (ref 6.0–8.3)

## 2016-12-04 LAB — LIPID PANEL
CHOLESTEROL: 184 mg/dL (ref 0–200)
HDL: 57.6 mg/dL (ref >39.00)
LDL Cholesterol: 113 mg/dL — ABNORMAL HIGH (ref 0–99)
NONHDL: 126.55
Total CHOL/HDL Ratio: 3
Triglycerides: 70 mg/dL (ref 0.0–149.0)
VLDL: 14 mg/dL (ref 0.0–40.0)

## 2016-12-04 LAB — HEMOGLOBIN A1C: HEMOGLOBIN A1C: 5.2 % (ref 4.6–6.5)

## 2016-12-04 MED ORDER — NAPROXEN 500 MG PO TABS: 500.0000 mg | ORAL_TABLET | Freq: Two times a day (BID) | ORAL | 0 refills | Status: DC

## 2016-12-04 NOTE — Progress Notes (Signed)
Chief Complaint  Patient presents with  . Establish Care    pt want to discuss low back pain(chronic)-has gotten worse-also having numbness in the (L) arm down to the fingers-started last night  . Hypertension    pt states her BP has been running high       New Patient Visit SUBJECTIVE: HPI: Rachel Oconnor is an 41 y.o.female who is being seen for establishing care.  Sunday, was painting and started having worsening back pain. Started around 20 years. L sided pain. Has had PT in the past. Tylenol 500 mg. No numbness, tingling, weakness, or loss of bowel/bladder function.   Hypertension Patient 2 years She does not monitor home blood pressures. She is not on any medications. She is not adhering to a healthy diet overall. Exercise: none She does have a family history of high blood pressure. She's not having any chest pain or shortness of breath.  Allergies  Allergen Reactions  . Amoxicillin     Red blotches    Past Medical History:  Diagnosis Date  . Allergy   . Depression   . Fibroids 2013  . Hypertension    no meds..pt states up last year  . Refusal of blood transfusions as patient is [employer] 2013  . Sleep apnea 07/12/2015   Past Surgical History:  Procedure Laterality Date  . ABLATION    . davinci myomectomy  04/2011  . HERNIA REPAIR    . MYOMECTOMY    . WISDOM TOOTH EXTRACTION     Social History   Social History  . Marital status: Married   Social History Main Topics  . Smoking status: Never Smoker  . Smokeless tobacco: Never Used  . Alcohol use Yes     Comment: social  . Drug use: No  . Sexual activity: Yes   Family History  Problem Relation Age of Onset  . Hypertension Mother   . Cancer Father   . Hypertension Maternal Grandmother    Takes no medications routinely.  Patient's last menstrual period was 11/06/2016 (approximate).  ROS Cardiovascular: Denies chest pain  Respiratory: Denies dyspnea   OBJECTIVE: BP 126/90 (BP Location:  Left Arm, Patient Position: Sitting, Cuff Size: Large)   Pulse 74   Temp 98.2 F (36.8 C) (Oral)   Ht 5\' 7"  (1.702 m)   Wt 197 lb 12.8 oz (89.7 kg)   LMP 11/06/2016 (Approximate)   SpO2 98%   BMI 30.98 kg/m   Constitutional: -  VS reviewed -  Well developed, well nourished, appears stated age -  No apparent distress  Psychiatric: -  Oriented to person, place, and time -  Memory intact -  Affect and mood normal -  Fluent conversation, good eye contact -  Judgment and insight age appropriate  Eye: -  Conjunctivae clear, no discharge -  Pupils symmetric, round, reactive to light  ENMT: -  Oral mucosa without lesions, tongue and uvula midline    Tonsils not enlarged, no erythema, no exudate, trachea midline    Pharynx moist, no lesions, no erythema  Neck: -  No gross swelling, no palpable masses -  Thyroid midline, not enlarged, mobile, no palpable masses  Cardiovascular: -  RRR, no murmurs, no bruits -  No LE edema  Respiratory: -  Normal respiratory effort, no accessory muscle use, no retraction -  Breath sounds equal, no wheezes, no ronchi, no crackles  Neurological:  -  CN II - XII grossly intact -  Patellar and calcaneal reflexes are  0/4 bilaterally, biceps reflexes 1/4 bilaterally, no clonus -  Sensation grossly intact to light touch, equal bilaterally  Musculoskeletal: -  No clubbing, no cyanosis -  Gait normal -  5/5 strength throughout  -  Poor range of motion of hamstrings bilaterally, worse on the left -  No muscle atrophy or asymmetry   Skin: -  Around the neck, there is a thin rim of hyperpigmentation -  Warm and dry to palpation   ASSESSMENT/PLAN: Acute bilateral low back pain without sciatica - Plan: naproxen (NAPROSYN) 500 MG tablet  Screening cholesterol level - Plan: Lipid panel  Acanthosis nigricans - Plan: Hemoglobin A1c  Elevated blood pressure reading - Plan: Comprehensive metabolic panel  Screening for malignant neoplasm of breast - Plan: MM  DIGITAL SCREENING BILATERAL  Patient instructed to sign release of records form from her previous PCP. Exam is suggestive of tight hamstrings and hip flexors. Home stretches and exercises given. Anti-inflammatory, may be used in conjunction with acetaminophen, and avoidance of aggravating activities. Patient should return in 2 weeks for a nurse visit. Her blood pressure looks good, we'll follow-up pending the above lab results. The patient voiced understanding and agreement to the plan.   Harlan, DO 12/04/16  11:27 AM

## 2016-12-04 NOTE — Progress Notes (Signed)
Pre visit review using our clinic review tool, if applicable. No additional management support is needed unless otherwise documented below in the visit note. 

## 2016-12-04 NOTE — Patient Instructions (Addendum)
Around 3 times per week, check your blood pressure 4 times per day. Twice in the morning and twice in the evening. The readings should be at least one minute apart. Write down these values and bring them to your next nurse visit/appointment.  When you check your BP, make sure you have been doing something calm/relaxing 5 minutes prior to checking. Both feet should be flat on the floor and you should be sitting. Use your left arm and make sure it is in a relaxed position (on a table), and that the cuff is at the approximate level/height of your heart.  Healthy Eating Plan Many factors influence your heart health, including eating and exercise habits. Heart (coronary) risk increases with abnormal blood fat (lipid) levels. Heart-healthy meal planning includes limiting unhealthy fats, increasing healthy fats, and making other small dietary changes. This includes maintaining a healthy body weight to help keep lipid levels within a normal range.  WHAT IS MY PLAN?  Your health care provider recommends that you:  Drink a glass of water before meals to help with satiety.  Eat slowly.  An alternative to the water is to add Metamucil. This will help with satiety as well. It does contain calories, unlike water.  WHAT TYPES OF FAT SHOULD I CHOOSE?  Choose healthy fats more often. Choose monounsaturated and polyunsaturated fats, such as olive oil and canola oil, flaxseeds, walnuts, almonds, and seeds.  Eat more omega-3 fats. Good choices include salmon, mackerel, sardines, tuna, flaxseed oil, and ground flaxseeds. Aim to eat fish at least two times each week.  Avoid foods with partially hydrogenated oils in them. These contain trans fats. Examples of foods that contain trans fats are stick margarine, some tub margarines, cookies, crackers, and other baked goods. If you are going to avoid a fat, this is the one to avoid!  WHAT GENERAL GUIDELINES DO I NEED TO FOLLOW?  Check food labels carefully to  identify foods with trans fats. Avoid these types of options when possible.  Fill one half of your plate with vegetables and green salads. Eat 4-5 servings of vegetables per day. A serving of vegetables equals 1 cup of raw leafy vegetables,  cup of raw or cooked cut-up vegetables, or  cup of vegetable juice.  Fill one fourth of your plate with whole grains. Look for the word "whole" as the first word in the ingredient list.  Fill one fourth of your plate with lean protein foods.  Eat 4-5 servings of fruit per day. A serving of fruit equals one medium whole fruit,  cup of dried fruit,  cup of fresh, frozen, or canned fruit. Try to avoid fruits in cups/syrups as the sugar content can be high.  Eat more foods that contain soluble fiber. Examples of foods that contain this type of fiber are apples, broccoli, carrots, beans, peas, and barley. Aim to get 20-30 g of fiber per day.  Eat more home-cooked food and less restaurant, buffet, and fast food.  Limit or avoid alcohol.  Limit foods that are high in starch and sugar.  Avoid fried foods when able.  Cook foods by using methods other than frying. Baking, boiling, grilling, and broiling are all great options. Other fat-reducing suggestions include: ? Removing the skin from poultry. ? Removing all visible fats from meats. ? Skimming the fat off of stews, soups, and gravies before serving them. ? Steaming vegetables in water or broth.  Lose weight if you are overweight. Losing just 5-10% of your initial body   weight can help your overall health and prevent diseases such as diabetes and heart disease.  Increase your consumption of nuts, legumes, and seeds to 4-5 servings per week. One serving of dried beans or legumes equals  cup after being cooked, one serving of nuts equals 1 ounces, and one serving of seeds equals  ounce or 1 tablespoon.  WHAT ARE GOOD FOODS CAN I EAT? Grains Grainy breads (try to find bread that is 3 g of fiber per  slice or greater), oatmeal, light popcorn. Whole-grain cereals. Rice and pasta, including brown rice and those that are made with whole wheat. Edamame pasta is a great alternative to grain pasta. It has a higher protein content. Try to avoid significant consumption of white bread, sugary cereals, or pastries/baked goods.  Vegetables All vegetables. Cooked white potatoes do not count as vegetables.  Fruits All fruits, but limit pineapple and bananas as these fruits have a higher sugar content.  Meats and Other Protein Sources Lean, well-trimmed beef, veal, pork, and lamb. Chicken and [place] without skin. All fish and shellfish. Wild duck, rabbit, pheasant, and venison. Egg whites or low-cholesterol egg substitutes. Dried beans, peas, lentils, and tofu.Seeds and most nuts.  Dairy Low-fat or nonfat cheeses, including ricotta, string, and mozzarella. Skim or 1% milk that is liquid, powdered, or evaporated. Buttermilk that is made with low-fat milk. Nonfat or low-fat yogurt. Soy/Almond milk are good alternatives if you cannot handle dairy.  Beverages Water is the best for you. Sports drinks with less sugar are more desirable unless you are a highly active athlete.  Sweets and Desserts Sherbets and fruit ices. Honey, jam, marmalade, jelly, and syrups. Dark chocolate.  Eat all sweets and desserts in moderation.  Fats and Oils Nonhydrogenated (trans-free) margarines. Vegetable oils, including soybean, sesame, sunflower, olive, peanut, safflower, corn, canola, and cottonseed. Salad dressings or mayonnaise that are made with a vegetable oil. Limit added fats and oils that you use for cooking, baking, salads, and as spreads.  Other Cocoa powder. Coffee and tea. Most condiments.  The items listed above may not be a complete list of recommended foods or beverages. Contact your dietitian for more options.  EXERCISES  RANGE OF MOTION (ROM) AND STRETCHING EXERCISES - Low Back Sprain Most people with  lower back pain will find that their symptoms get worse with excessive bending forward (flexion) or arching at the lower back (extension). The exercises that will help resolve your symptoms will focus on the opposite motion.  Your physician, physical therapist or athletic trainer will help you determine which exercises will be most helpful to resolve your lower back pain. Do not complete any exercises without first consulting with your caregiver. Discontinue any exercises which make your symptoms worse, until you speak to your caregiver. If you have pain, numbness or tingling which travels down into your buttocks, leg or foot, the goal of the therapy is for these symptoms to move closer to your back and eventually resolve. Sometimes, these leg symptoms will get better, but your lower back pain may worsen. This is often an indication of progress in your rehabilitation. Be very alert to any changes in your symptoms and the activities in which you participated in the 24 hours prior to the change. Sharing this information with your caregiver will allow him or her to most efficiently treat your condition. These exercises may help you when beginning to rehabilitate your injury. Your symptoms may resolve with or without further involvement from your physician, physical therapist or athletic  trainer. While completing these exercises, remember:   Restoring tissue flexibility helps normal motion to return to the joints. This allows healthier, less painful movement and activity.  An effective stretch should be held for at least 30 seconds.  A stretch should never be painful. You should only feel a gentle lengthening or release in the stretched tissue. FLEXION RANGE OF MOTION AND STRETCHING EXERCISES:  STRETCH - Flexion, Single Knee to Chest   Lie on a firm bed or floor with both legs extended in front of you.  Keeping one leg in contact with the floor, bring your opposite knee to your chest. Hold your leg in  place by either grabbing behind your thigh or at your knee.  Pull until you feel a gentle stretch in your low back. Hold 15-20 seconds.  Slowly release your grasp and repeat the exercise with the opposite side. Repeat 2 times. Complete this exercise 1-2 times per day.   STRETCH - Flexion, Double Knee to Chest  Lie on a firm bed or floor with both legs extended in front of you.  Keeping one leg in contact with the floor, bring your opposite knee to your chest.  Tense your stomach muscles to support your back and then lift your other knee to your chest. Hold your legs in place by either grabbing behind your thighs or at your knees.  Pull both knees toward your chest until you feel a gentle stretch in your low back. Hold 15-20 seconds.  Tense your stomach muscles and slowly return one leg at a time to the floor. Repeat 2 times. Complete this exercise 1-2 times per day.   STRETCH - Low Trunk Rotation  Lie on a firm bed or floor. Keeping your legs in front of you, bend your knees so they are both pointed toward the ceiling and your feet are flat on the floor.  Extend your arms out to the side. This will stabilize your upper body by keeping your shoulders in contact with the floor.  Gently and slowly drop both knees together to one side until you feel a gentle stretch in your low back. Hold for 15-20 seconds.  Tense your stomach muscles to support your lower back as you bring your knees back to the starting position. Repeat the exercise to the other side. Repeat 2 times. Complete this exercise 1-2 times per day  EXTENSION RANGE OF MOTION AND FLEXIBILITY EXERCISES:  STRETCH - Extension, Prone on Elbows   Lie on your stomach on the floor, a bed will be too soft. Place your palms about shoulder width apart and at the height of your head.  Place your elbows under your shoulders. If this is too painful, stack pillows under your chest.  Allow your body to relax so that your hips drop lower  and make contact more completely with the floor.  Hold this position for 15-20 seconds.  Slowly return to lying flat on the floor. Repeat 2 times. Complete this exercise 1-2 times per day.   RANGE OF MOTION - Extension, Prone Press Ups  Lie on your stomach on the floor, a bed will be too soft. Place your palms about shoulder width apart and at the height of your head.  Keeping your back as relaxed as possible, slowly straighten your elbows while keeping your hips on the floor. You may adjust the placement of your hands to maximize your comfort. As you gain motion, your hands will come more underneath your shoulders.  Hold this position  15-20 seconds.  Slowly return to lying flat on the floor. Repeat 2 times. Complete this exercise 1-2 times per day.   RANGE OF MOTION- Quadruped, Neutral Spine   Assume a hands and knees position on a firm surface. Keep your hands under your shoulders and your knees under your hips. You may place padding under your knees for comfort.  Drop your head and point your tailbone toward the ground below you. This will round out your lower back like an angry cat. Hold this position for 15-20 seconds.  Slowly lift your head and release your tail bone so that your back sags into a large arch, like an old horse.  Hold this position for 15-20 seconds.  Repeat this until you feel limber in your low back.  Now, find your "sweet spot." This will be the most comfortable position somewhere between the two previous positions. This is your neutral spine. Once you have found this position, tense your stomach muscles to support your low back.  Hold this position for 15-20 seconds. Repeat 2 times. Complete this exercise 1-2 times per day.  STRENGTHENING EXERCISES - Low Back Sprain These exercises may help you when beginning to rehabilitate your injury. These exercises should be done near your "sweet spot." This is the neutral, low-back arch, somewhere between fully  rounded and fully arched, that is your least painful position. When performed in this safe range of motion, these exercises can be used for people who have either a flexion or extension based injury. These exercises may resolve your symptoms with or without further involvement from your physician, physical therapist or athletic trainer. While completing these exercises, remember:   Muscles can gain both the endurance and the strength needed for everyday activities through controlled exercises.  Complete these exercises as instructed by your physician, physical therapist or athletic trainer. Increase the resistance and repetitions only as guided.  You may experience muscle soreness or fatigue, but the pain or discomfort you are trying to eliminate should never worsen during these exercises. If this pain does worsen, stop and make certain you are following the directions exactly. If the pain is still present after adjustments, discontinue the exercise until you can discuss the trouble with your caregiver.  STRENGTHENING - Deep Abdominals, Pelvic Tilt   Lie on a firm bed or floor. Keeping your legs in front of you, bend your knees so they are both pointed toward the ceiling and your feet are flat on the floor.  Tense your lower abdominal muscles to press your low back into the floor. This motion will rotate your pelvis so that your tail bone is scooping upwards rather than pointing at your feet or into the floor. With a gentle tension and even breathing, hold this position for 10-15 seconds. Repeat 2 times. Complete this exercise 1 time per day.   STRENGTHENING - Abdominals, Crunches   Lie on a firm bed or floor. Keeping your legs in front of you, bend your knees so they are both pointed toward the ceiling and your feet are flat on the floor. Cross your arms over your chest.  Slightly tip your chin down without bending your neck.  Tense your abdominals and slowly lift your trunk high enough to  just clear your shoulder blades. Lifting higher can put excessive stress on the lower back and does not further strengthen your abdominal muscles.  Control your return to the starting position. Repeat 2 times. Complete this exercise once every 1-2 days.   STRENGTHENING -  Quadruped, Opposite UE/LE Lift   Assume a hands and knees position on a firm surface. Keep your hands under your shoulders and your knees under your hips. You may place padding under your knees for comfort.  Find your neutral spine and gently tense your abdominal muscles so that you can maintain this position. Your shoulders and hips should form a rectangle that is parallel with the floor and is not twisted.  Keeping your trunk steady, lift your right hand no higher than your shoulder and then your left leg no higher than your hip. Make sure you are not holding your breath. Hold this position for 15-20 seconds.  Continuing to keep your abdominal muscles tense and your back steady, slowly return to your starting position. Repeat with the opposite arm and leg. Repeat 2 times. Complete this exercise once every 1-2 days.   STRENGTHENING - Abdominals and Quadriceps, Straight Leg Raise   Lie on a firm bed or floor with both legs extended in front of you.  Keeping one leg in contact with the floor, bend the other knee so that your foot can rest flat on the floor.  Find your neutral spine, and tense your abdominal muscles to maintain your spinal position throughout the exercise.  Slowly lift your straight leg off the floor about 6 inches for a count of 15, making sure to not hold your breath.  Still keeping your neutral spine, slowly lower your leg all the way to the floor. Repeat this exercise with each leg 2 times. Complete this exercise once every 1-2 days. POSTURE AND BODY MECHANICS CONSIDERATIONS - Low Back Sprain Keeping correct posture when sitting, standing or completing your activities will reduce the stress put on  different body tissues, allowing injured tissues a chance to heal and limiting painful experiences. The following are general guidelines for improved posture. Your physician or physical therapist will provide you with any instructions specific to your needs. While reading these guidelines, remember:  The exercises prescribed by your provider will help you have the flexibility and strength to maintain correct postures.  The correct posture provides the best environment for your joints to work. All of your joints have less wear and tear when properly supported by a spine with good posture. This means you will experience a healthier, less painful body.  Correct posture must be practiced with all of your activities, especially prolonged sitting and standing. Correct posture is as important when doing repetitive low-stress activities (typing) as it is when doing a single heavy-load activity (lifting).  RESTING POSITIONS Consider which positions are most painful for you when choosing a resting position. If you have pain with flexion-based activities (sitting, bending, stooping, squatting), choose a position that allows you to rest in a less flexed posture. You would want to avoid curling into a fetal position on your side. If your pain worsens with extension-based activities (prolonged standing, working overhead), avoid resting in an extended position such as sleeping on your stomach. Most people will find more comfort when they rest with their spine in a more neutral position, neither too rounded nor too arched. Lying on a non-sagging bed on your side with a pillow between your knees, or on your back with a pillow under your knees will often provide some relief. Keep in mind, being in any one position for a prolonged period of time, no matter how correct your posture, can still lead to stiffness. PROPER SITTING POSTURE In order to minimize stress and discomfort on your spine,  you must sit with correct posture.  Sitting with good posture should be effortless for a healthy body. Returning to good posture is a gradual process. Many people can work toward this most comfortably by using various supports until they have the flexibility and strength to maintain this posture on their own. When sitting with proper posture, your ears will fall over your shoulders and your shoulders will fall over your hips. You should use the back of the chair to support your upper back. Your lower back will be in a neutral position, just slightly arched. You may place a small pillow or folded towel at the base of your lower back for  support.  When working at a desk, create an environment that supports good, upright posture. Without extra support, muscles tire, which leads to excessive strain on joints and other tissues. Keep these recommendations in mind:  CHAIR:  A chair should be able to slide under your desk when your back makes contact with the back of the chair. This allows you to work closely.  The chair's height should allow your eyes to be level with the upper part of your monitor and your hands to be slightly lower than your elbows.  BODY POSITION  Your feet should make contact with the floor. If this is not possible, use a foot rest.  Keep your ears over your shoulders. This will reduce stress on your neck and low back.  INCORRECT SITTING POSTURES  If you are feeling tired and unable to assume a healthy sitting posture, do not slouch or slump. This puts excessive strain on your back tissues, causing more damage and pain. Healthier options include:  Using more support, like a lumbar pillow.  Switching tasks to something that requires you to be upright or walking.  Talking a brief walk.  Lying down to rest in a neutral-spine position.  PROLONGED STANDING WHILE SLIGHTLY LEANING FORWARD  When completing a task that requires you to lean forward while standing in one place for a long time, place either foot up on a  stationary 2-4 inch high object to help maintain the best posture. When both feet are on the ground, the lower back tends to lose its slight inward curve. If this curve flattens (or becomes too large), then the back and your other joints will experience too much stress, tire more quickly, and can cause pain.  CORRECT STANDING POSTURES Proper standing posture should be assumed with all daily activities, even if they only take a few moments, like when brushing your teeth. As in sitting, your ears should fall over your shoulders and your shoulders should fall over your hips. You should keep a slight tension in your abdominal muscles to brace your spine. Your tailbone should point down to the ground, not behind your body, resulting in an over-extended swayback posture.   INCORRECT STANDING POSTURES  Common incorrect standing postures include a forward head, locked knees and/or an excessive swayback. WALKING Walk with an upright posture. Your ears, shoulders and hips should all line-up.  PROLONGED ACTIVITY IN A FLEXED POSITION When completing a task that requires you to bend forward at your waist or lean over a low surface, try to find a way to stabilize 3 out of 4 of your limbs. You can place a hand or elbow on your thigh or rest a knee on the surface you are reaching across. This will provide you more stability, so that your muscles do not tire as quickly. By keeping your knees  relaxed, or slightly bent, you will also reduce stress across your lower back. CORRECT LIFTING TECHNIQUES  DO :  Assume a wide stance. This will provide you more stability and the opportunity to get as close as possible to the object which you are lifting.  Tense your abdominals to brace your spine. Bend at the knees and hips. Keeping your back locked in a neutral-spine position, lift using your leg muscles. Lift with your legs, keeping your back straight.  Test the weight of unknown objects before attempting to lift  them.  Try to keep your elbows locked down at your sides in order get the best strength from your shoulders when carrying an object.     Always ask for help when lifting heavy or awkward objects. INCORRECT LIFTING TECHNIQUES DO NOT:   Lock your knees when lifting, even if it is a small object.  Bend and twist. Pivot at your feet or move your feet when needing to change directions.  Assume that you can safely pick up even a paperclip without proper posture.

## 2016-12-04 NOTE — Addendum Note (Signed)
Addended by: Harl Bowie on: 12/04/2016 01:38 PM   Modules accepted: Orders

## 2016-12-05 ENCOUNTER — Telehealth: Payer: Self-pay | Admitting: Family Medicine

## 2016-12-05 MED ORDER — CYCLOBENZAPRINE HCL 5 MG PO TABS: 5.0000 mg | ORAL_TABLET | Freq: Three times a day (TID) | ORAL | 0 refills | Status: DC | PRN

## 2016-12-05 NOTE — Telephone Encounter (Signed)
Flexeril called in, do not take during day if it makes drowsy, take 2-3 hours before expected bedtime.

## 2016-12-05 NOTE — Telephone Encounter (Signed)
Called and spoke with the pt and informed her of the message below.  Pt verbalized understanding and agreed.//AB/CMA

## 2016-12-05 NOTE — Telephone Encounter (Signed)
Caller name: Relationship to patient: Self Can be reached: [phone]  Pharmacy:  [id number] - [place], [place] [address] [phone] (Phone) [phone] (Fax)     Reason for call: Request a Rx for a stronger medication. States Naprosyn is not stong enough

## 2016-12-05 NOTE — Telephone Encounter (Signed)
Please advise.//AB/CMA 

## 2016-12-15 ENCOUNTER — Ambulatory Visit (HOSPITAL_BASED_OUTPATIENT_CLINIC_OR_DEPARTMENT_OTHER)
Admission: RE | Admit: 2016-12-15 | Discharge: 2016-12-15 | Disposition: A | Discharge status: Home / Self Care | Payer: Managed Care, Other (non HMO) | Source: Ambulatory Visit | Attending: Family Medicine | Admitting: Family Medicine

## 2016-12-15 ENCOUNTER — Encounter (HOSPITAL_BASED_OUTPATIENT_CLINIC_OR_DEPARTMENT_OTHER): Payer: Self-pay

## 2016-12-15 DIAGNOSIS — N6489 Other specified disorders of breast: Secondary | ICD-10-CM | POA: Insufficient documentation

## 2016-12-15 DIAGNOSIS — Z1239 Encounter for other screening for malignant neoplasm of breast: Secondary | ICD-10-CM

## 2016-12-15 DIAGNOSIS — Z1231 Encounter for screening mammogram for malignant neoplasm of breast: Secondary | ICD-10-CM | POA: Diagnosis not present

## 2016-12-23 ENCOUNTER — Telehealth: Payer: Self-pay | Admitting: Internal Medicine

## 2016-12-23 ENCOUNTER — Ambulatory Visit (INDEPENDENT_AMBULATORY_CARE_PROVIDER_SITE_OTHER): Payer: Managed Care, Other (non HMO) | Admitting: Internal Medicine

## 2016-12-23 DIAGNOSIS — R030 Elevated blood-pressure reading, without diagnosis of hypertension: Secondary | ICD-10-CM | POA: Diagnosis not present

## 2016-12-23 NOTE — Telephone Encounter (Signed)
Patient here for a BP check. Her home readings are usually high 153, 149, 171, 174. Diastolic BP usually around 115. BP check today 164/98. We checked we her cuff and is 156/104. Plan:  Has hypertension based on this readings Recommend low salt diet, start amlodipine 5 mg. Send a prescription #30 and one refill. Office visit with PCP in 3 weeks. Additionally, she reported to my nurse she had a single episode of tightness in the chest, patient thinks related to a stress due to a conference call. No shortness of breath. If she has anymore symptoms needs to call 911.

## 2016-12-23 NOTE — Progress Notes (Addendum)
Pre visit review using our clinic tool,if applicable. No additional management support is needed unless otherwise documented below in the visit note.   Patient in for BP check per order from DN. Wendling dated 12/04/16.   Patient denies headache, dizziness, SOB and chest pain. States she has slight tightness in chest due to a confrence call she just had at work.   BP using patients machine =164/108 P=73  BP using office BP cuff = 149/99 P = 71  Per Dr. Larose Kells patient to start Amlodipine 5 mg daily, Low salt diet and return for office visit with Dr. Nani Ravens in  3 weeks. Appointment scheduled for 01/14/17 at 7:15 am. Advised to call office or go to ED if chest tightness worsens. Patient agreed.  Kathlene November, MD

## 2016-12-23 NOTE — Patient Instructions (Signed)
Per Dr. Larose Kells patient to start Amlodipine 5 mg daily, Low salt diet and return for office visit with Dr. Nani Ravens in  3 weeks. Appointment scheduled for 01/14/17 at 7:15 am.

## 2016-12-23 NOTE — Telephone Encounter (Signed)
Patient instruction sheet given to patient. Amlodipine 5 mg #30 with 1 RF called in to pharmacy, return appointment with Dr. Deloris Ping for 3 weeks given to patient. Advised to start low salt diet and call office or go to ED if tightness in chest worsens or any other symptoms arise. Patient agreed.

## 2016-12-29 ENCOUNTER — Other Ambulatory Visit: Payer: Self-pay

## 2016-12-29 MED ORDER — AMLODIPINE BESYLATE 5 MG PO TABS: 5.0000 mg | ORAL_TABLET | Freq: Every day | ORAL | 1 refills | Status: DC

## 2017-01-14 ENCOUNTER — Ambulatory Visit: Payer: Managed Care, Other (non HMO) | Admitting: Family Medicine

## 2017-03-16 ENCOUNTER — Encounter: Payer: Self-pay | Admitting: Family Medicine

## 2017-03-16 ENCOUNTER — Ambulatory Visit (INDEPENDENT_AMBULATORY_CARE_PROVIDER_SITE_OTHER): Payer: Managed Care, Other (non HMO) | Admitting: Family Medicine

## 2017-03-16 DIAGNOSIS — I10 Essential (primary) hypertension: Secondary | ICD-10-CM | POA: Diagnosis not present

## 2017-03-16 LAB — BASIC METABOLIC PANEL
BUN: 13 mg/dL (ref 6–23)
CO2: 26 mEq/L (ref 19–32)
Calcium: 9.4 mg/dL (ref 8.4–10.5)
Chloride: 105 mEq/L (ref 96–112)
Creatinine, Ser: 0.82 mg/dL (ref 0.40–1.20)
GFR: 98.69 mL/min (ref >60.00)
Glucose, Bld: 87 mg/dL (ref 70–99)
POTASSIUM: 4.3 meq/L (ref 3.5–5.1)
Sodium: 138 mEq/L (ref 135–145)

## 2017-03-16 MED ORDER — HYDROCHLOROTHIAZIDE 25 MG PO TABS: 25.0000 mg | ORAL_TABLET | Freq: Every day | ORAL | 2 refills | Status: DC

## 2017-03-16 NOTE — Progress Notes (Signed)
Chief Complaint  Patient presents with  . Follow-up    3 weeks on HTN    Subjective Rachel Oconnor is a 41 y.o. female who presents for hypertension follow up. She does monitor home blood pressures. Blood pressures ranging from 150-160's/90-100's on average. She is compliant with medications. Patient has these side effects of medication: none She is starting to adhere to a healthy diet overall. Current exercise: none   Past Medical History:  Diagnosis Date  . Allergy   . Depression   . Fibroids 2013  . Hypertension    no meds..pt states up last year  . Refusal of blood transfusions as patient is Jehovah's Witness 2013  . Sleep apnea 07/12/2015   Family History  Problem Relation Age of Onset  . Hypertension Mother   . Cancer Father   . Hypertension Maternal Grandmother    Medications Has not been taking any medication routinely.   Allergies Allergies  Allergen Reactions  . Amoxicillin     Red blotches    Review of Systems Cardiovascular: no chest pain Respiratory:  no shortness of breath  Exam BP (!) 164/94 (BP Location: Left Arm, Patient Position: Sitting, Cuff Size: Normal)   Pulse 65   Temp 98 F (36.7 C) (Oral)   Ht 5\' 7"  (1.702 m)   Wt 200 lb 9.6 oz (91 kg)   LMP 03/10/2017 (Exact Date)   SpO2 99%   BMI 31.42 kg/m  General:  well developed, well nourished, in no apparent distress Skin:  warm, no pallor or diaphoresis Eyes:  pupils equal and round, sclera anicteric without injection Heart :RRR, no murmurs, no bruits, no LE edema Lungs:  clear to auscultation, no accessory muscle use Psych: well oriented with normal range of affect and appropriate judgment/insight  Essential hypertension - Plan: hydrochlorothiazide (HYDRODIURIL) 25 MG tablet, Basic metabolic panel, Basic metabolic panel  Orders as above. Counseled on diet and exercise- she is going to implement.  F/u in 1 mo, 1 week for BMP. The patient voiced understanding and agreement to the  plan.  Greater than 25 minutes were spent face to face with the patient with greater than 50% of this time spent counseling on blood pressure medication options, side effects of above, follow up, diet and exercise.    [place], DO 03/16/17  8:56 AM

## 2017-03-16 NOTE — Patient Instructions (Addendum)
Check blood pressure twice daily, 3 times per week. You don't have the wait a minute for a second reading anymore.   Take your medicine daily.  You don't need to be fasting for lab check in 1 week.

## 2017-03-17 ENCOUNTER — Ambulatory Visit (INDEPENDENT_AMBULATORY_CARE_PROVIDER_SITE_OTHER): Payer: Managed Care, Other (non HMO) | Admitting: Obstetrics & Gynecology

## 2017-03-17 ENCOUNTER — Encounter: Payer: Self-pay | Admitting: Obstetrics & Gynecology

## 2017-03-17 VITALS — BP 134/86 | Wt 198.0 lb

## 2017-03-17 DIAGNOSIS — I10 Essential (primary) hypertension: Secondary | ICD-10-CM | POA: Diagnosis not present

## 2017-03-17 DIAGNOSIS — Z1151 Encounter for screening for human papillomavirus (HPV): Secondary | ICD-10-CM | POA: Diagnosis not present

## 2017-03-17 DIAGNOSIS — Z01411 Encounter for gynecological examination (general) (routine) with abnormal findings: Secondary | ICD-10-CM

## 2017-03-17 DIAGNOSIS — Z30011 Encounter for initial prescription of contraceptive pills: Secondary | ICD-10-CM | POA: Diagnosis not present

## 2017-03-17 DIAGNOSIS — D259 Leiomyoma of uterus, unspecified: Secondary | ICD-10-CM

## 2017-03-17 MED ORDER — NORETHINDRONE 0.35 MG PO TABS: 1.0000 | ORAL_TABLET | Freq: Every day | ORAL | 4 refills | Status: DC

## 2017-03-17 NOTE — Progress Notes (Signed)
Rachel Oconnor 06-19-1976 [id number]   History:    41 y.o.  G0 Stable partner  RP:  Established patient presenting for annual gyn exam and irregular periods/bloating with known Uterine Fibroids  HPI:  Heavy period x 6-7 days, 03/10/2017.  Felt bloated during menses.  No pelvic pain currently.  Usually lighter menses given h/o Endometrial Ablation.  Menses qmonth, regular.  Using BCPs on-off to control cycle when goes on vacations.  Natural rhythm method for contraception otherwise.  No pelvic pain.  Normal vaginal secretions.  Breasts wnl.  Mictions normal.  BMs normal.  CHTN, just started on Hydrodiuril.  Past medical history,surgical history, family history and social history were all reviewed and documented in the EPIC chart.  Gynecologic History Patient's last menstrual period was 03/10/2017 (exact date). Contraception: OCP (estrogen/progesterone) Last Pap: 2016. Results were: normal per patient Last mammogram: 11/2016. Results were: Negative  Obstetric History OB History  Gravida Para Term Preterm AB Living  0 0 0 0 0 0  SAB TAB Ectopic Multiple Live Births  0 0 0 0 0         ROS: A ROS was performed and pertinent positives and negatives are included in the history.  GENERAL: No fevers or chills. HEENT: No change in vision, no earache, sore throat or sinus congestion. NECK: No pain or stiffness. CARDIOVASCULAR: No chest pain or pressure. No palpitations. PULMONARY: No shortness of breath, cough or wheeze. GASTROINTESTINAL: No abdominal pain, nausea, vomiting or diarrhea, melena or bright red blood per rectum. GENITOURINARY: No urinary frequency, urgency, hesitancy or dysuria. MUSCULOSKELETAL: No joint or muscle pain, no back pain, no recent trauma. DERMATOLOGIC: No rash, no itching, no lesions. ENDOCRINE: No polyuria, polydipsia, no heat or cold intolerance. No recent change in weight. HEMATOLOGICAL: No anemia or easy bruising or bleeding. NEUROLOGIC: No headache, seizures,  numbness, tingling or weakness. PSYCHIATRIC: No depression, no loss of interest in normal activity or change in sleep pattern.     Exam:   BP 134/86   Wt 198 lb (89.8 kg)   LMP 03/10/2017 (Exact Date)   BMI 31.01 kg/m   Body mass index is 31.01 kg/m.  General appearance : Well developed well nourished female. No acute distress HEENT: Eyes: no retinal hemorrhage or exudates,  Neck supple, trachea midline, no carotid bruits, no thyroidmegaly Lungs: Clear to auscultation, no rhonchi or wheezes, or rib retractions  Heart: Regular rate and rhythm, no murmurs or gallops Breast:Examined in sitting and supine position were symmetrical in appearance, no palpable masses or tenderness,  no skin retraction, no nipple inversion, no nipple discharge, no skin discoloration, no axillary or supraclavicular lymphadenopathy Abdomen: no palpable masses or tenderness, no rebound or guarding Extremities: no edema or skin discoloration or tenderness  Pelvic:  Bartholin, Urethra, Skene Glands: Within normal limits             Vagina: No gross lesions or discharge  Cervix: No gross lesions or discharge.  Pap/HPV done.  Uterus  RV, normal size to mildly increased, normal shape and consistency, non-tender and mobile  Adnexa  Without masses or tenderness  Anus and perineum  normal    Assessment/Plan:  41 y.o. female for annual exam   1. Encounter for gynecological examination with abnormal finding Gyn exam with small uterine myomas.  Pap/HPV done.  Breasts wnl.  Screening Mammo 11/2016 neg.  2. Uterine leiomyoma, unspecified location No pelvic pain.  Last period heavier than usual, s/p Endometrial Ablation.  Given heavier bleeding,  cHTN just started on Meds and need for contraception, started on Progestin only BCPs.  3. Encounter for initial prescription of contraceptive pills D/C Estrogen/Progestin BCPs.  Started on Progestin only BCPs.  Usage/recommendations/risks reviewed.  4. Essential  hypertension Just started on Anti-HTN medication, Hydrodiuril.  Low salt diet.  Physical activity.  Decision to d/c Estrogen-Progestin BCPs.  Started on Progestin only BCP.  Counseling on above issues >50% x 10 minutes.  Princess Bruins MD, 2:53 PM 03/17/2017

## 2017-03-17 NOTE — Patient Instructions (Signed)
1. Encounter for gynecological examination with abnormal finding Gyn exam with small uterine myomas.  Pap/HPV done.  Breasts wnl.  Screening Mammo 11/2016 neg.  2. Uterine leiomyoma, unspecified location No pelvic pain.  Last period heavier than usual, s/p Endometrial Ablation.  Given heavier bleeding, cHTN just started on Meds and need for contraception, started on Progestin only BCPs.  3. Encounter for initial prescription of contraceptive pills D/C Estrogen/Progestin BCPs.  Started on Progestin only BCPs.  Usage/recommendations/risks reviewed.  4. Essential hypertension Just started on Anti-HTN medication, Hydrodiuril.  Low salt diet.  Physical activity.  Decision to d/c Estrogen-Progestin BCPs.  Started on Progestin only BCP.  Rachel Oconnor, it was a pleasure to see you today!  I will inform you of your results as soon as available.  Please call me to schedule a Pelvic US if your menstrual cycle continues to be heavier, becomes irregular or if you develop pelvic pain.

## 2017-03-17 NOTE — Addendum Note (Signed)
Addended by: Thurnell Garbe A on: 03/17/2017 03:32 PM   Modules accepted: Orders

## 2017-03-19 LAB — PAP, TP IMAGING W/ HPV RNA, RFLX HPV TYPE 16,18/45: HPV mRNA, High Risk: NOT DETECTED

## 2017-03-23 ENCOUNTER — Other Ambulatory Visit: Payer: Managed Care, Other (non HMO)

## 2017-04-22 ENCOUNTER — Ambulatory Visit: Payer: Managed Care, Other (non HMO) | Admitting: Family Medicine

## 2017-04-23 ENCOUNTER — Encounter: Payer: Self-pay | Admitting: Family Medicine

## 2017-04-23 ENCOUNTER — Ambulatory Visit (INDEPENDENT_AMBULATORY_CARE_PROVIDER_SITE_OTHER): Payer: Managed Care, Other (non HMO) | Admitting: Family Medicine

## 2017-04-23 VITALS — BP 136/90 | HR 70 | Temp 98.0°F | Ht 67.0 in | Wt 202.4 lb

## 2017-04-23 DIAGNOSIS — I10 Essential (primary) hypertension: Secondary | ICD-10-CM

## 2017-04-23 NOTE — Patient Instructions (Addendum)
DASH Eating Plan DASH stands for "Dietary Approaches to Stop Hypertension." The DASH eating plan is a healthy eating plan that has been shown to reduce high blood pressure (hypertension). It may also reduce your risk for type 2 diabetes, heart disease, and stroke. The DASH eating plan may also help with weight loss. What are tips for following this plan? General guidelines  Avoid eating more than 2,300 mg (milligrams) of salt (sodium) a day. If you have hypertension, you may need to reduce your sodium intake to 1,500 mg a day.  Limit alcohol intake to no more than 1 drink a day for nonpregnant women and 2 drinks a day for men. One drink equals 12 oz of beer, 5 oz of wine, or 1 oz of hard liquor.  Work with your health care provider to maintain a healthy body weight or to lose weight. Ask what an ideal weight is for you.  Get at least 30 minutes of exercise that causes your heart to beat faster (aerobic exercise) most days of the week. Activities may include walking, swimming, or biking.  Work with your health care provider or diet and nutrition specialist (dietitian) to adjust your eating plan to your individual calorie needs. Reading food labels  Check food labels for the amount of sodium per serving. Choose foods with less than 5 percent of the Daily Value of sodium. Generally, foods with less than 300 mg of sodium per serving fit into this eating plan.  To find whole grains, look for the word "whole" as the first word in the ingredient list. Shopping  Buy products labeled as "low-sodium" or "no salt added."  Buy fresh foods. Avoid canned foods and premade or frozen meals. Cooking  Avoid adding salt when cooking. Use salt-free seasonings or herbs instead of table salt or sea salt. Check with your health care provider or pharmacist before using salt substitutes.  Do not fry foods. Cook foods using healthy methods such as baking, boiling, grilling, and broiling instead.  Cook with  heart-healthy oils, such as olive, canola, soybean, or sunflower oil. Meal planning   Eat a balanced diet that includes: ? 5 or more servings of fruits and vegetables each day. At each meal, try to fill half of your plate with fruits and vegetables. ? Up to 6-8 servings of whole grains each day. ? Less than 6 oz of lean meat, poultry, or fish each day. A 3-oz serving of meat is about the same size as a deck of cards. One egg equals 1 oz. ? 2 servings of low-fat dairy each day. ? A serving of nuts, seeds, or beans 5 times each week. ? Heart-healthy fats. Healthy fats called Omega-3 fatty acids are found in foods such as flaxseeds and coldwater fish, like sardines, salmon, and mackerel.  Limit how much you eat of the following: ? Canned or prepackaged foods. ? Food that is high in trans fat, such as fried foods. ? Food that is high in saturated fat, such as fatty meat. ? Sweets, desserts, sugary drinks, and other foods with added sugar. ? Full-fat dairy products.  Do not salt foods before eating.  Try to eat at least 2 vegetarian meals each week.  Eat more home-cooked food and less restaurant, buffet, and fast food.  When eating at a restaurant, ask that your food be prepared with less salt or no salt, if possible. What foods are recommended? The items listed may not be a complete list. Talk with your dietitian about what   dietary choices are best for you. Grains Whole-grain or whole-wheat bread. Whole-grain or whole-wheat pasta. Brown rice. Oatmeal. Quinoa. Bulgur. Whole-grain and low-sodium cereals. Pita bread. Low-fat, low-sodium crackers. Whole-wheat flour tortillas. Vegetables Fresh or frozen vegetables (raw, steamed, roasted, or grilled). Low-sodium or reduced-sodium tomato and vegetable juice. Low-sodium or reduced-sodium tomato sauce and tomato paste. Low-sodium or reduced-sodium canned vegetables. Fruits All fresh, dried, or frozen fruit. Canned fruit in natural juice (without  added sugar). Meat and other protein foods Skinless chicken or turkey. Ground chicken or turkey. Pork with fat trimmed off. Fish and seafood. Egg whites. Dried beans, peas, or lentils. Unsalted nuts, nut butters, and seeds. Unsalted canned beans. Lean cuts of beef with fat trimmed off. Low-sodium, lean deli meat. Dairy Low-fat (1%) or fat-free (skim) milk. Fat-free, low-fat, or reduced-fat cheeses. Nonfat, low-sodium ricotta or cottage cheese. Low-fat or nonfat yogurt. Low-fat, low-sodium cheese. Fats and oils Soft margarine without trans fats. Vegetable oil. Low-fat, reduced-fat, or light mayonnaise and salad dressings (reduced-sodium). Canola, safflower, olive, soybean, and sunflower oils. Avocado. Seasoning and other foods Herbs. Spices. Seasoning mixes without salt. Unsalted popcorn and pretzels. Fat-free sweets. What foods are not recommended? The items listed may not be a complete list. Talk with your dietitian about what dietary choices are best for you. Grains Baked goods made with fat, such as croissants, muffins, or some breads. Dry pasta or rice meal packs. Vegetables Creamed or fried vegetables. Vegetables in a cheese sauce. Regular canned vegetables (not low-sodium or reduced-sodium). Regular canned tomato sauce and paste (not low-sodium or reduced-sodium). Regular tomato and vegetable juice (not low-sodium or reduced-sodium). Pickles. Olives. Fruits Canned fruit in a light or heavy syrup. Fried fruit. Fruit in cream or butter sauce. Meat and other protein foods Fatty cuts of meat. Ribs. Fried meat. Bacon. Sausage. Bologna and other processed lunch meats. Salami. Fatback. Hotdogs. Bratwurst. Salted nuts and seeds. Canned beans with added salt. Canned or smoked fish. Whole eggs or egg yolks. Chicken or turkey with skin. Dairy Whole or 2% milk, cream, and half-and-half. Whole or full-fat cream cheese. Whole-fat or sweetened yogurt. Full-fat cheese. Nondairy creamers. Whipped toppings.  Processed cheese and cheese spreads. Fats and oils Butter. Stick margarine. Lard. Shortening. Ghee. Bacon fat. Tropical oils, such as coconut, palm kernel, or palm oil. Seasoning and other foods Salted popcorn and pretzels. Onion salt, garlic salt, seasoned salt, table salt, and sea salt. Worcestershire sauce. Tartar sauce. Barbecue sauce. Teriyaki sauce. Soy sauce, including reduced-sodium. Steak sauce. Canned and packaged gravies. Fish sauce. Oyster sauce. Cocktail sauce. Horseradish that you find on the shelf. Ketchup. Mustard. Meat flavorings and tenderizers. Bouillon cubes. Hot sauce and Tabasco sauce. Premade or packaged marinades. Premade or packaged taco seasonings. Relishes. Regular salad dressings. Where to find more information:  National Heart, Lung, and Blood Institute: www.nhlbi.nih.gov  American Heart Association: www.heart.org Summary  The DASH eating plan is a healthy eating plan that has been shown to reduce high blood pressure (hypertension). It may also reduce your risk for type 2 diabetes, heart disease, and stroke.  With the DASH eating plan, you should limit salt (sodium) intake to 2,300 mg a day. If you have hypertension, you may need to reduce your sodium intake to 1,500 mg a day.  When on the DASH eating plan, aim to eat more fresh fruits and vegetables, whole grains, lean proteins, low-fat dairy, and heart-healthy fats.  Work with your health care provider or diet and nutrition specialist (dietitian) to adjust your eating plan to your individual   calorie needs. This information is not intended to replace advice given to you by your health care provider. Make sure you discuss any questions you have with your health care provider. Document Released: 08/28/2011 Document Revised: 09/01/2016 Document Reviewed: 09/01/2016 Elsevier Interactive Patient Education  2017 [place].  Goal weight loss: 3 lbs in 6 weeks; if you can do more, that's even better!

## 2017-04-23 NOTE — Progress Notes (Signed)
Chief Complaint  Patient presents with  . Follow-up    1 mos on BP    Subjective Rachel Oconnor is a 41 y.o. female who presents for hypertension follow up. She does not routinely monitor home blood pressures. She is compliant with medications- HCTZ 25 mg daily. Patient has these side effects of medication: none She is not adhering to a healthy diet overall. Current exercise: none currently    Past Medical History:  Diagnosis Date  . Allergy   . Depression   . Fibroids 2013  . Hypertension    no meds..pt states up last year  . Refusal of blood transfusions as patient is Jehovah's Witness 2013  . Sleep apnea 07/12/2015   Family History  Problem Relation Age of Onset  . Hypertension Mother   . Cancer Father   . Hypertension Maternal Grandmother   . Breast cancer Maternal Aunt   . Diabetes Maternal Aunt   . Breast cancer Paternal Aunt   . Cancer Paternal Aunt        colon  . Diabetes Paternal Aunt    Medications Current Outpatient Prescriptions on File Prior to Visit  Medication Sig Dispense Refill  . hydrochlorothiazide (HYDRODIURIL) 25 MG tablet Take 1 tablet (25 mg total) by mouth daily. 30 tablet 2  . norethindrone (MICRONOR,CAMILA,ERRIN) 0.35 MG tablet Take 1 tablet (0.35 mg total) by mouth daily. 3 Package 4   Allergies Allergies  Allergen Reactions  . Amoxicillin     Red blotches    Review of Systems Cardiovascular: no chest pain Respiratory:  no shortness of breath  Exam BP 136/90 (BP Location: Right Arm, Patient Position: Sitting, Cuff Size: Large)   Pulse 70   Temp 98 F (36.7 C) (Oral)   Ht 5\' 7"  (1.702 m)   Wt 202 lb 6.4 oz (91.8 kg)   LMP 04/11/2017   SpO2 98%   BMI 31.70 kg/m  General:  well developed, well nourished, in no apparent distress Skin:  warm, no pallor or diaphoresis Eyes:  pupils equal and round, sclera anicteric without injection Heart :RRR, no murmurs, no bruits, no LE edema Lungs:  clear to auscultation, no accessory muscle  use Psych: well oriented with normal range of affect and appropriate judgment/insight  Essential hypertension  Counseled on diet and exercise. F/u in 6 weeks. The patient voiced understanding and agreement to the plan.  Greater than 21 minutes were spent face to face with the patient with greater than 50% of this time spent counseling on diet- IF, DASH, meal-prepping, food diaries, exercise- walking, exercise programs on YouTube, medication, weight loss and follow up expectations.    [place], DO 04/23/17  8:51 AM

## 2017-05-06 ENCOUNTER — Other Ambulatory Visit: Payer: Self-pay | Admitting: Family Medicine

## 2017-05-06 DIAGNOSIS — I10 Essential (primary) hypertension: Secondary | ICD-10-CM

## 2017-05-06 MED ORDER — HYDROCHLOROTHIAZIDE 25 MG PO TABS: 25.0000 mg | ORAL_TABLET | Freq: Every day | ORAL | 0 refills | Status: DC

## 2017-05-06 NOTE — Telephone Encounter (Signed)
Please fax refill/thx [email or web address]

## 2017-05-06 NOTE — Telephone Encounter (Signed)
Rx sent to the pharmacy by e-script.//AB/CMA 

## 2017-07-29 ENCOUNTER — Emergency Department (HOSPITAL_COMMUNITY): Payer: Managed Care, Other (non HMO)

## 2017-07-29 ENCOUNTER — Encounter (HOSPITAL_COMMUNITY): Payer: Self-pay | Admitting: *Deleted

## 2017-07-29 ENCOUNTER — Emergency Department (HOSPITAL_COMMUNITY)
Admission: EM | Admit: 2017-07-29 | Discharge: 2017-07-29 | Disposition: A | Discharge status: Home / Self Care | Payer: Managed Care, Other (non HMO) | Attending: Physician Assistant | Admitting: Physician Assistant

## 2017-07-29 ENCOUNTER — Telehealth: Payer: Self-pay | Admitting: Family Medicine

## 2017-07-29 DIAGNOSIS — Z733 Stress, not elsewhere classified: Secondary | ICD-10-CM | POA: Insufficient documentation

## 2017-07-29 DIAGNOSIS — I10 Essential (primary) hypertension: Secondary | ICD-10-CM | POA: Diagnosis not present

## 2017-07-29 DIAGNOSIS — Z880 Allergy status to penicillin: Secondary | ICD-10-CM | POA: Insufficient documentation

## 2017-07-29 DIAGNOSIS — R0789 Other chest pain: Secondary | ICD-10-CM | POA: Insufficient documentation

## 2017-07-29 DIAGNOSIS — F419 Anxiety disorder, unspecified: Secondary | ICD-10-CM | POA: Insufficient documentation

## 2017-07-29 DIAGNOSIS — R0602 Shortness of breath: Secondary | ICD-10-CM | POA: Diagnosis not present

## 2017-07-29 LAB — BASIC METABOLIC PANEL
Anion gap: 8 (ref 5–15)
BUN: 10 mg/dL (ref 6–20)
CHLORIDE: 104 mmol/L (ref 101–111)
CO2: 20 mmol/L — AB (ref 22–32)
CREATININE: 0.9 mg/dL (ref 0.44–1.00)
Calcium: 9.3 mg/dL (ref 8.9–10.3)
GFR calc Af Amer: >60 mL/min (ref >60)
GFR calc non Af Amer: >60 mL/min (ref >60)
GLUCOSE: 75 mg/dL (ref 65–99)
Potassium: 6.2 mmol/L — ABNORMAL HIGH (ref 3.5–5.1)
Sodium: 132 mmol/L — ABNORMAL LOW (ref 135–145)

## 2017-07-29 LAB — I-STAT TROPONIN, ED
Troponin i, poc: 0 ng/mL (ref 0.00–0.08)
Troponin i, poc: 0 ng/mL (ref 0.00–0.08)

## 2017-07-29 LAB — I-STAT CHEM 8, ED
BUN: 15 mg/dL (ref 6–20)
CHLORIDE: 106 mmol/L (ref 101–111)
CREATININE: 0.8 mg/dL (ref 0.44–1.00)
Calcium, Ion: 1.18 mmol/L (ref 1.15–1.40)
GLUCOSE: 95 mg/dL (ref 65–99)
HCT: 36 % (ref 36.0–46.0)
Hemoglobin: 12.2 g/dL (ref 12.0–15.0)
POTASSIUM: 3.5 mmol/L (ref 3.5–5.1)
Sodium: 138 mmol/L (ref 135–145)
TCO2: 20 mmol/L — ABNORMAL LOW (ref 22–32)

## 2017-07-29 LAB — HEPATIC FUNCTION PANEL
ALK PHOS: 49 U/L (ref 38–126)
ALT: 30 U/L (ref 14–54)
AST: 70 U/L — ABNORMAL HIGH (ref 15–41)
Albumin: 4.1 g/dL (ref 3.5–5.0)
BILIRUBIN DIRECT: 1.2 mg/dL — AB (ref 0.1–0.5)
BILIRUBIN INDIRECT: 0.5 mg/dL (ref 0.3–0.9)
Total Bilirubin: 1.7 mg/dL — ABNORMAL HIGH (ref 0.3–1.2)

## 2017-07-29 LAB — CBC
HEMATOCRIT: 37.3 % (ref 36.0–46.0)
Hemoglobin: 12.6 g/dL (ref 12.0–15.0)
MCH: 31.1 pg (ref 26.0–34.0)
MCHC: 33.8 g/dL (ref 30.0–36.0)
MCV: 92.1 fL (ref 78.0–100.0)
Platelets: 374 10*3/uL (ref 150–400)
RBC: 4.05 MIL/uL (ref 3.87–5.11)
RDW: 15.2 % (ref 11.5–15.5)
WBC: 6 10*3/uL (ref 4.0–10.5)

## 2017-07-29 LAB — PREGNANCY, URINE: Preg Test, Ur: NEGATIVE

## 2017-07-29 LAB — LIPASE, BLOOD: Lipase: 32 U/L (ref 11–51)

## 2017-07-29 MED ORDER — GI COCKTAIL ~~LOC~~
30.0000 mL | Freq: Once | ORAL | Status: AC
  Administered 2017-07-29: 30 mL via ORAL
  Filled 2017-07-29: qty 30

## 2017-07-29 MED ORDER — IBUPROFEN 800 MG PO TABS
800.0000 mg | ORAL_TABLET | Freq: Once | ORAL | Status: AC
  Administered 2017-07-29: 800 mg via ORAL
  Filled 2017-07-29: qty 1

## 2017-07-29 MED ORDER — LORAZEPAM 1 MG PO TABS: 0.5000 mg | ORAL_TABLET | Freq: Three times a day (TID) | ORAL | 0 refills | Status: DC | PRN

## 2017-07-29 MED ORDER — OMEPRAZOLE 20 MG PO CPDR: 20.0000 mg | DELAYED_RELEASE_CAPSULE | Freq: Every day | ORAL | 0 refills | Status: DC

## 2017-07-29 MED ORDER — LORAZEPAM 1 MG PO TABS
0.5000 mg | ORAL_TABLET | Freq: Once | ORAL | Status: AC
  Administered 2017-07-29: 0.5 mg via ORAL
  Filled 2017-07-29: qty 1

## 2017-07-29 NOTE — ED Provider Notes (Addendum)
[facility] Provider Note   CSN: [id number] Arrival date & time: 07/29/17  1711     History   Chief Complaint Chief Complaint  Patient presents with  . Chest Pain    HPI Rachel Oconnor is a 41 y.o. female.  HPI   Is a 41 year old female presenting with atypical chest pain patient.  Patient has history of hypertension.  No history of diabetes, no history of cholesterol.  No history of early cardiac death.  Patient had not no prolonged travel, went on a cruise, but less than 2 hours on a flight.  Has had no recent surgeries.  Is not on any estrogens.  She has had a weeklong of chest pain.  She reports is worse when she thinks of something stressful.  She reports she has a lot of home stressors.  She reports it is not associated with eating.  Not associated with exertion, not made worse by walking.  It is intermittent.  Heaviness on her chest.  Not sharp.  Does not radiate.  Mostly associate with trouble taking deep breaths.  Past Medical History:  Diagnosis Date  . Allergy   . Depression   . Fibroids 2013  . Hypertension    no meds..pt states up last year  . Refusal of blood transfusions as patient is Jehovah's Witness 2013  . Sleep apnea 07/12/2015    Patient Active Problem List   Diagnosis Date Noted  . Essential hypertension 03/16/2017  . Sleep apnea 07/12/2015  . Snoring 07/12/2015    Past Surgical History:  Procedure Laterality Date  . ABLATION    . davinci myomectomy  04/2011  . HERNIA REPAIR    . MYOMECTOMY    . WISDOM TOOTH EXTRACTION      OB History    Gravida Para Term Preterm AB Living   0 0 0 0 0 0   SAB TAB Ectopic Multiple Live Births   0 0 0 0 0       Home Medications    Prior to Admission medications   Medication Sig Start Date End Date Taking? Authorizing Provider  hydrochlorothiazide (HYDRODIURIL) 25 MG tablet Take 1 tablet (25 mg total) by mouth daily. 05/06/17  Yes Shelda Pal, DO    ibuprofen (ADVIL,MOTRIN) 200 MG tablet Take 600 mg every 6 (six) hours as needed by mouth for mild pain.   Yes [provider]  OVER THE COUNTER MEDICATION Take 1 capsule daily by mouth. antioxidant   Yes [provider]  OVER THE COUNTER MEDICATION Take 1 capsule daily by mouth. Greens veggies   Yes [provider]  Turmeric POWD Take 1 Scoop daily by mouth. Mix with hot tea and apple cider vinegar    Yes [provider]  norethindrone (MICRONOR,CAMILA,ERRIN) 0.35 MG tablet Take 1 tablet (0.35 mg total) by mouth daily. Patient not taking: Reported on 07/29/2017 03/17/17   Princess Bruins, MD    Family History Family History  Problem Relation Age of Onset  . Hypertension Mother   . Cancer Father   . Hypertension Maternal Grandmother   . Breast cancer Maternal Aunt   . Diabetes Maternal Aunt   . Breast cancer Paternal Aunt   . Cancer Paternal Aunt        colon  . Diabetes Paternal Aunt     Social History Social History   Tobacco Use  . Smoking status: Never Smoker  . Smokeless tobacco: Never Used  Substance Use Topics  .  Alcohol use: Yes    Comment: wine with dinner   . Drug use: No     Allergies   Amoxicillin   Review of Systems Review of Systems  Constitutional: Negative for activity change, fatigue and fever.  HENT: Positive for sinus pressure. Negative for congestion.   Respiratory: Positive for shortness of breath.   Cardiovascular: Positive for chest pain.  Gastrointestinal: Negative for abdominal pain.  Genitourinary: Negative for difficulty urinating and dysuria.  All other systems reviewed and are negative.    Physical Exam Updated Vital Signs BP (!) 143/96   Pulse 70   Resp 17   LMP 07/08/2017 (Exact Date)   SpO2 98%   Physical Exam  Constitutional: She is oriented to person, place, and time. She appears well-developed and well-nourished.  HENT:  Head: Normocephalic and atraumatic.  Eyes: Right eye exhibits  no discharge.  Cardiovascular: Normal rate, regular rhythm, normal heart sounds and normal pulses.  No murmur heard. Pulmonary/Chest: Effort normal and breath sounds normal. No accessory muscle usage. No tachypnea. No respiratory distress. She has no wheezes. She has no rales.  Abdominal: Soft. She exhibits no distension. There is no tenderness.  Neurological: She is oriented to person, place, and time.  Skin: Skin is warm and dry. She is not diaphoretic.  Psychiatric: She has a normal mood and affect.  Nursing note and vitals reviewed.    ED Treatments / Results  Labs (all labs ordered are listed, but only abnormal results are displayed) Labs Reviewed  CBC  BASIC METABOLIC PANEL  HEPATIC FUNCTION PANEL  LIPASE, BLOOD  I-STAT TROPONIN, ED    EKG  EKG Interpretation  Date/Time:  Wednesday July 29 2017 17:46:07 EST Ventricular Rate:  72 PR Interval:    QRS Duration: 82 QT Interval:  409 QTC Calculation: 448 R Axis:   40 Text Interpretation:  Sinus rhythm Normal sinus rhythm Confirmed by Zenovia Jarred [phone]) on 07/29/2017 5:48:40 PM       Radiology Dg Chest 2 View  Result Date: 07/29/2017 CLINICAL DATA:  Acute chest pain and shortness of breath today. EXAM: CHEST  2 VIEW COMPARISON:  None. FINDINGS: The cardiomediastinal silhouette is unremarkable. There is no evidence of focal airspace disease, pulmonary edema, suspicious pulmonary nodule/mass, pleural effusion, or pneumothorax. No acute bony abnormalities are identified. IMPRESSION: No active cardiopulmonary disease. Electronically Signed   By: Margarette Canada M.D.   On: 07/29/2017 17:48    Procedures Procedures (including critical care time)  Medications Ordered in ED Medications - No data to display   Initial Impression / Assessment and Plan / ED Course  I have reviewed the triage vital signs and the nursing notes.  Pertinent labs & imaging results that were available during my care of the patient were  reviewed by me and considered in my medical decision making (see chart for details).     Is a 41 year old female presenting with atypical chest pain patient.  Patient has history of hypertension.  No history of diabetes, no history of cholesterol.  No history of early cardiac death.  Patient had not no prolonged travel, went on a cruise, but less than 2 hours on a flight.  Has had no recent surgeries.  Is not on any estrogens.  She has had a weeklong of chest pain.  She reports is worse when she thinks of something stressful.  She reports she has a lot of home stressors.  She reports it is not associated with eating.  Not associated with exertion,  not made worse by walking.  It is intermittent.  Heaviness on her chest.  Not sharp.  Does not radiate.  Mostly associate with trouble taking deep breaths.  6:52 PM PERC negative.  Patient heart score of 2.  Will do delta troponin.  11:38 PM Delta troponin negative.  Patient thinks it is from anxiety and I am inclined to agree.  We will give short prescription of antianxiety is.  However we discussed with both family members and patient that she needs to follow-up with primary care as soon as possible and get into talk therapy since it is more effective than medication alone.    11:48 PM Patient accidentally left without her prescription.  We will leave at [facility] for patient to pick up tomorrow.  Final Clinical Impressions(s) / ED Diagnoses   Final diagnoses:  None    ED Discharge Orders    None       Macarthur Critchley, MD 07/29/17 2340    Macarthur Critchley, MD 07/29/17 2348

## 2017-07-29 NOTE — Discharge Instructions (Signed)
We are unsure what is causing your pain.  It could be secondary to gastric reflux.  It could be other things such as anxiety or esophageal spasm.  We want you to continue to monitor symptoms and write them down and follow-up with your primary care as soon as possible.

## 2017-07-29 NOTE — ED Notes (Signed)
Family at bedside. 

## 2017-07-29 NOTE — Telephone Encounter (Signed)
[facility] - Client [facility]     Patient Name: Rachel Oconnor Initial Comment Caller has chest pain and shortness of breath, felt like this a couple of days,but today is worse.   DOB: October 03, 1975      Nurse Assessment  Nurse: Venetia Maxon, RN, Manuela Schwartz Date/Time (Eastern Time): 07/29/2017 3:51:00 PM  Confirm and document reason for call. If symptomatic, describe symptoms. ---Caller has chest pain is level 7/10 and is constant and shortness of breath, felt like this a couple of days,but today is worse. no fever or symptoms of illness.  Does the patient have any new or worsening symptoms? ---Yes  Will a triage be completed? ---Yes  Related visit to physician within the last 2 weeks? ---No  Does the PT have any chronic conditions? (i.e. diabetes, asthma, etc.) ---Yes  List chronic conditions. ---HTN LMP : 07/08/17  Is the patient pregnant or possibly pregnant? (Ask all females between the ages of 63-55) ---No  Is this a behavioral health or substance abuse call? ---No    Guidelines     Guideline Title Affirmed Question Affirmed Notes   Chest Pain SEVERE difficulty breathing (e.g., struggling for each breath, speaks in single words)    Final Disposition User   Call EMS 911 Now Venetia Maxon, RN, Edwena Bunde Disagree/Comply Comply  Caller Understands Yes  PreDisposition Go to Urgent Care/Walk-In Clinic

## 2017-07-29 NOTE — Telephone Encounter (Signed)
Pt called in to schedule an apt. Pt says that she has shortness of breath and chest pains.   Forwarded call to team health for triaging.

## 2017-07-29 NOTE — ED Triage Notes (Signed)
Per ems- pt report central chest pressure. Pt states that it started on Friday ans has worsened. Denies radiation. Reports some sob. Initial pain 6/10 now 5/10 after 2 nitro and 324mg  ASA. Pt reports increased stress at home as well. 132/80 70 NSR 98% RA

## 2017-07-30 ENCOUNTER — Ambulatory Visit: Payer: Managed Care, Other (non HMO) | Admitting: Family Medicine

## 2017-07-30 ENCOUNTER — Encounter: Payer: Self-pay | Admitting: Family Medicine

## 2017-07-30 VITALS — BP 134/92 | HR 67 | Temp 98.6°F | Ht 67.0 in | Wt 198.0 lb

## 2017-07-30 DIAGNOSIS — R0789 Other chest pain: Secondary | ICD-10-CM

## 2017-07-30 DIAGNOSIS — F418 Other specified anxiety disorders: Secondary | ICD-10-CM

## 2017-07-30 DIAGNOSIS — R7401 Elevation of levels of liver transaminase levels: Secondary | ICD-10-CM

## 2017-07-30 DIAGNOSIS — E875 Hyperkalemia: Secondary | ICD-10-CM

## 2017-07-30 DIAGNOSIS — R740 Nonspecific elevation of levels of transaminase and lactic acid dehydrogenase [LDH]: Secondary | ICD-10-CM | POA: Diagnosis not present

## 2017-07-30 LAB — BASIC METABOLIC PANEL
BUN: 15 mg/dL (ref 6–23)
CHLORIDE: 104 meq/L (ref 96–112)
CO2: 27 mEq/L (ref 19–32)
CREATININE: 0.81 mg/dL (ref 0.40–1.20)
Calcium: 9.9 mg/dL (ref 8.4–10.5)
GFR: 99.92 mL/min (ref >60.00)
Glucose, Bld: 90 mg/dL (ref 70–99)
Potassium: 4.4 mEq/L (ref 3.5–5.1)
Sodium: 137 mEq/L (ref 135–145)

## 2017-07-30 LAB — HEPATIC FUNCTION PANEL
ALK PHOS: 44 U/L (ref 39–117)
ALT: 18 U/L (ref 0–35)
AST: 17 U/L (ref 0–37)
Albumin: 4.4 g/dL (ref 3.5–5.2)
BILIRUBIN DIRECT: 0.1 mg/dL (ref 0.0–0.3)
TOTAL PROTEIN: 7.6 g/dL (ref 6.0–8.3)
Total Bilirubin: 0.8 mg/dL (ref 0.2–1.2)

## 2017-07-30 MED ORDER — SERTRALINE HCL 50 MG PO TABS: 50.0000 mg | ORAL_TABLET | Freq: Every day | ORAL | 3 refills | Status: DC

## 2017-07-30 MED ORDER — AMLODIPINE BESYLATE 5 MG PO TABS: 5.0000 mg | ORAL_TABLET | Freq: Every day | ORAL | 2 refills | Status: DC

## 2017-07-30 MED ORDER — HYDROXYZINE HCL 50 MG PO TABS: 50.0000 mg | ORAL_TABLET | Freq: Three times a day (TID) | ORAL | 0 refills | Status: DC | PRN

## 2017-07-30 MED ORDER — AMLODIPINE BESYLATE 5 MG PO TABS: 5.0000 mg | ORAL_TABLET | Freq: Every day | ORAL | 3 refills | Status: DC

## 2017-07-30 NOTE — Patient Instructions (Signed)
Let me know if you change your mind about seeing a cardiologist.  Let us know if you need anything.

## 2017-07-30 NOTE — Progress Notes (Signed)
Chief Complaint  Patient presents with  . Follow-up    chest pain    Rachel Oconnor is a 41 y.o. female here for evaluation of chest pain.  Duration of issue: 1.5 weeks Quality: Pressure Palliation: ativan Provocation: stress Severity: 8/10 Radiation: none Duration of chest pain: constant Associated symptoms: none Cardiac history: HTN Family heart history: HTN, no heart disease otherwise Smoker? No  ROS:  Cardiac: No current chest pain Lungs: No SOB  Past Medical History:  Diagnosis Date  . Allergy   . Depression   . Fibroids 2013  . Hypertension    no meds..pt states up last year  . Refusal of blood transfusions as patient is Jehovah's Witness 2013  . Sleep apnea 07/12/2015   Family History  Problem Relation Age of Onset  . Hypertension Mother   . Cancer Father   . Hypertension Maternal Grandmother   . Breast cancer Maternal Aunt   . Diabetes Maternal Aunt   . Breast cancer Paternal Aunt   . Cancer Paternal Aunt        colon  . Diabetes Paternal Aunt    Social History   Socioeconomic History  . Marital status: Married  Tobacco Use  . Smoking status: Never Smoker  . Smokeless tobacco: Never Used  Substance and Sexual Activity  . Alcohol use: Yes    Comment: wine with dinner   . Drug use: No  . Sexual activity: Yes    Partners: Male    Birth control/protection: Pill    Comment: 1st intercourse- 16, partners- 5   Allergies as of 07/30/2017      Reactions   Amoxicillin    Red blotches      Medication List        Accurate as of 07/30/17  4:52 PM. Always use your most recent med list.          amLODipine 5 MG tablet Commonly known as:  NORVASC Take 1 tablet (5 mg total) daily by mouth.   hydrochlorothiazide 25 MG tablet Commonly known as:  HYDRODIURIL Take 1 tablet (25 mg total) by mouth daily.   hydrOXYzine 50 MG tablet Commonly known as:  ATARAX/VISTARIL Take 1 tablet (50 mg total) 3 (three) times daily as needed by mouth.   ibuprofen  200 MG tablet Commonly known as:  ADVIL,MOTRIN Take 600 mg every 6 (six) hours as needed by mouth for mild pain.   LORazepam 1 MG tablet Commonly known as:  ATIVAN Take 0.5 tablets (0.5 mg total) every 8 (eight) hours as needed by mouth for anxiety.   norethindrone 0.35 MG tablet Commonly known as:  MICRONOR,CAMILA,ERRIN Take 1 tablet (0.35 mg total) by mouth daily.   omeprazole 20 MG capsule Commonly known as:  PRILOSEC Take 1 capsule (20 mg total) daily by mouth.   OVER THE COUNTER MEDICATION Take 1 capsule daily by mouth. antioxidant   OVER THE COUNTER MEDICATION Take 1 capsule daily by mouth. Greens veggies   sertraline 50 MG tablet Commonly known as:  ZOLOFT Take 1 tablet (50 mg total) daily by mouth. Take 1/2 tab daily for first 2 weeks.   Turmeric Powd Take 1 Scoop daily by mouth. Mix with hot tea and apple cider vinegar       BP (!) 134/92 (BP Location: Right Arm, Patient Position: Sitting, Cuff Size: Large)   Pulse 67   Temp 98.6 F (37 C) (Oral)   Ht 5\' 7"  (1.702 m)   Wt 198 lb (89.8 kg)  LMP 07/08/2017 (Exact Date)   SpO2 99%   BMI 31.01 kg/m  Gen: awake, alert, appears stated age 59: PERRLA, MMM Neck: No masses or asymmetry Heart: RRR, no bruits, no LE edema Lungs: CTAB, no accessory muscle use Abd: Soft, NT, ND, no masses or organomegaly MSK: chest pain is not reproducible to palptation Psych: Age appropriate judgment and insight, nml mood and affect  Situational anxiety  Elevated ALT measurement - Plan: Hepatic function panel  Hyperkalemia - Plan: Basic metabolic panel  Atypical chest pain  Orders as above. EKG neg in ED, follow up on some labs. Offered referral to cards, declined and wishes to start tx for anti-anxiety with Zoloft and Atarax prn. Counseling contact info given.  Add amlodipine for HTN. F/u in 1 mo. The patient voiced understanding and agreement to the plan.  Nicolaus, DO 07/30/17 4:52 PM

## 2017-07-30 NOTE — Progress Notes (Signed)
Pre visit review using our clinic review tool, if applicable. No additional management support is needed unless otherwise documented below in the visit note. 

## 2017-09-03 ENCOUNTER — Other Ambulatory Visit: Payer: Self-pay | Admitting: Family Medicine

## 2017-09-03 DIAGNOSIS — I10 Essential (primary) hypertension: Secondary | ICD-10-CM

## 2017-12-21 ENCOUNTER — Ambulatory Visit: Payer: Self-pay | Admitting: Family

## 2017-12-21 ENCOUNTER — Encounter: Payer: Self-pay | Admitting: Family

## 2017-12-21 VITALS — BP 147/89 | HR 70 | Temp 98.6°F | Resp 16 | Ht 67.0 in | Wt 202.6 lb

## 2017-12-21 DIAGNOSIS — R55 Syncope and collapse: Secondary | ICD-10-CM

## 2017-12-21 DIAGNOSIS — J029 Acute pharyngitis, unspecified: Secondary | ICD-10-CM | POA: Diagnosis not present

## 2017-12-21 DIAGNOSIS — J069 Acute upper respiratory infection, unspecified: Secondary | ICD-10-CM

## 2017-12-21 DIAGNOSIS — I10 Essential (primary) hypertension: Secondary | ICD-10-CM

## 2017-12-21 DIAGNOSIS — R0683 Snoring: Secondary | ICD-10-CM | POA: Diagnosis not present

## 2017-12-21 LAB — POCT RAPID STREP A (OFFICE): RAPID STREP A SCREEN: NEGATIVE

## 2017-12-21 NOTE — Patient Instructions (Addendum)
Please complete lab work prior to leaving. Drink lots of fluids today. No driving until you are cleared by neurology. Go to the ER if you have recurrent loss of consciousness. Follow up with Dr. Nani Ravens in 1 week.

## 2017-12-21 NOTE — Progress Notes (Signed)
Subjective:    Patient ID: Rachel Oconnor, female    DOB: [birth year], 42 y.o.   MRN: [id number]  HPI  Rachel Oconnor is a 42 yr old female who presents today following unwitnessed syncopal episode today at home.   She reports that she has had a "sinus infection" since Wednesday.  Has nasal congestion, cough, sneezing and headache. She also reports some leg cramps at night which wakes her up at night sometimes. She reports that this AM she woke up slumped over to her side on her work chair. Was seated at her desk when episode occurred.  No witnesses and no memory of the event.  This occurred in the late morning she believes.  She has no idea how long she was unconscious for.  Reports that she had one episode of syncope years back when she had food poisoning. Denies assiated N/V/D, abdominal pain, sob, chest pain, or incontinence. She reports fever of 101 on Wednesday of last week. Husband notes that she has not been eating and drinking well this past week.  He thinks that she may be dehydrated.   Phone rang and it "woke her." She reports that she took her blood pressure med and zyrtec this AM.  She had 2 boiled eggs for breakfast with tea and some candy. Has not been using her flonase.   She also has a hx of mild OSA per chart and notes that a dental device was recommended but she just got new insurance.   Requesting referral. Husband reports that patient has poor sleep hygiene and often stays up late at night.     Review of Systems See HPI  Past Medical History:  Diagnosis Date  . Allergy   . Depression   . Fibroids 2013  . Hypertension    no meds..pt states up last year  . Refusal of blood transfusions as patient is Jehovah's Witness 2013  . Sleep apnea 07/12/2015     Social History   Socioeconomic History  . Marital status: Married    Spouse name: Not on file  . Number of children: Not on file  . Years of education: Not on file  . Highest education level: Not on file  Occupational  History  . Not on file  Social Needs  . Financial resource strain: Not on file  . Food insecurity:    Worry: Not on file    Inability: Not on file  . Transportation needs:    Medical: Not on file    Non-medical: Not on file  Tobacco Use  . Smoking status: Never Smoker  . Smokeless tobacco: Never Used  Substance and Sexual Activity  . Alcohol use: Yes    Comment: wine with dinner   . Drug use: No  . Sexual activity: Yes    Partners: Male    Birth control/protection: Pill    Comment: 1st intercourse- 3, partners- 5  Lifestyle  . Physical activity:    Days per week: Not on file    Minutes per session: Not on file  . Stress: Not on file  Relationships  . Social connections:    Talks on phone: Not on file    Gets together: Not on file    Attends religious service: Not on file    Active member of club or organization: Not on file    Attends meetings of clubs or organizations: Not on file    Relationship status: Not on file  . Intimate partner violence:  Fear of current or ex partner: Not on file    Emotionally abused: Not on file    Physically abused: Not on file    Forced sexual activity: Not on file  Other Topics Concern  . Not on file  Social History Narrative  . Not on file    Past Surgical History:  Procedure Laterality Date  . ABLATION    . davinci myomectomy  04/2011  . HERNIA REPAIR    . MYOMECTOMY    . WISDOM TOOTH EXTRACTION      Family History  Problem Relation Age of Onset  . Hypertension Mother   . Cancer Father   . Hypertension Maternal Grandmother   . Breast cancer Maternal Aunt   . Diabetes Maternal Aunt   . Breast cancer Paternal Aunt   . Cancer Paternal Aunt        colon  . Diabetes Paternal Aunt     Allergies  Allergen Reactions  . Amoxicillin     Red blotches    Current Outpatient Medications on File Prior to Visit  Medication Sig Dispense Refill  . amLODipine (NORVASC) 5 MG tablet Take 1 tablet (5 mg total) daily by mouth. 90  tablet 3  . Turmeric POWD Take 1 Scoop daily by mouth. Mix with hot tea and apple cider vinegar      No current facility-administered medications on file prior to visit.     BP (!) 147/89 (BP Location: Right Arm, Cuff Size: Large)   Pulse 70   Temp 98.6 F (37 C) (Oral)   Resp 16   Ht 5\' 7"  (1.702 m)   Wt 202 lb 9.6 oz (91.9 kg)   SpO2 100%   BMI 31.73 kg/m       Past Medical History:  Diagnosis Date  . Allergy   . Depression   . Fibroids 2013  . Hypertension    no meds..pt states up last year  . Refusal of blood transfusions as patient is Jehovah's Witness 2013  . Sleep apnea 07/12/2015     Social History   Socioeconomic History  . Marital status: Married    Spouse name: Not on file  . Number of children: Not on file  . Years of education: Not on file  . Highest education level: Not on file  Occupational History  . Not on file  Social Needs  . Financial resource strain: Not on file  . Food insecurity:    Worry: Not on file    Inability: Not on file  . Transportation needs:    Medical: Not on file    Non-medical: Not on file  Tobacco Use  . Smoking status: Never Smoker  . Smokeless tobacco: Never Used  Substance and Sexual Activity  . Alcohol use: Yes    Comment: wine with dinner   . Drug use: No  . Sexual activity: Yes    Partners: Male    Birth control/protection: Pill    Comment: 1st intercourse- 26, partners- 5  Lifestyle  . Physical activity:    Days per week: Not on file    Minutes per session: Not on file  . Stress: Not on file  Relationships  . Social connections:    Talks on phone: Not on file    Gets together: Not on file    Attends religious service: Not on file    Active member of club or organization: Not on file    Attends meetings of clubs or organizations: Not on file  Relationship status: Not on file  . Intimate partner violence:    Fear of current or ex partner: Not on file    Emotionally abused: Not on file    Physically  abused: Not on file    Forced sexual activity: Not on file  Other Topics Concern  . Not on file  Social History Narrative  . Not on file    Past Surgical History:  Procedure Laterality Date  . ABLATION    . davinci myomectomy  04/2011  . HERNIA REPAIR    . MYOMECTOMY    . WISDOM TOOTH EXTRACTION      Family History  Problem Relation Age of Onset  . Hypertension Mother   . Cancer Father   . Hypertension Maternal Grandmother   . Breast cancer Maternal Aunt   . Diabetes Maternal Aunt   . Breast cancer Paternal Aunt   . Cancer Paternal Aunt        colon  . Diabetes Paternal Aunt     Allergies  Allergen Reactions  . Amoxicillin     Red blotches    Current Outpatient Medications on File Prior to Visit  Medication Sig Dispense Refill  . amLODipine (NORVASC) 5 MG tablet Take 1 tablet (5 mg total) daily by mouth. 90 tablet 3  . Turmeric POWD Take 1 Scoop daily by mouth. Mix with hot tea and apple cider vinegar     . hydrochlorothiazide (HYDRODIURIL) 25 MG tablet TAKE 1 TABLET BY MOUTH EVERY DAY 90 tablet 0  . hydrOXYzine (ATARAX/VISTARIL) 50 MG tablet Take 1 tablet (50 mg total) 3 (three) times daily as needed by mouth. 30 tablet 0  . ibuprofen (ADVIL,MOTRIN) 200 MG tablet Take 600 mg every 6 (six) hours as needed by mouth for mild pain.    Marland Kitchen LORazepam (ATIVAN) 1 MG tablet Take 0.5 tablets (0.5 mg total) every 8 (eight) hours as needed by mouth for anxiety. 5 tablet 0  . norethindrone (MICRONOR,CAMILA,ERRIN) 0.35 MG tablet Take 1 tablet (0.35 mg total) by mouth daily. 3 Package 4  . omeprazole (PRILOSEC) 20 MG capsule Take 1 capsule (20 mg total) daily by mouth. 30 capsule 0  . OVER THE COUNTER MEDICATION Take 1 capsule daily by mouth. antioxidant    . OVER THE COUNTER MEDICATION Take 1 capsule daily by mouth. Greens veggies    . sertraline (ZOLOFT) 50 MG tablet Take 1 tablet (50 mg total) daily by mouth. Take 1/2 tab daily for first 2 weeks. 30 tablet 3   No current  facility-administered medications on file prior to visit.     BP (!) 147/89 (BP Location: Right Arm, Cuff Size: Large)   Pulse 70   Temp 98.6 F (37 C) (Oral)   Resp 16   Ht 5\' 7"  (1.702 m)   Wt 202 lb 9.6 oz (91.9 kg)   SpO2 100%   BMI 31.73 kg/m    Objective:   Physical Exam  Constitutional: She is oriented to person, place, and time. She appears well-developed and well-nourished.  Tired appearing AA female, NAD  HENT:  Head: Normocephalic and atraumatic.  Right Ear: Tympanic membrane and ear canal normal.  Left Ear: Tympanic membrane and ear canal normal.  Mouth/Throat: Posterior oropharyngeal erythema present. No oropharyngeal exudate or posterior oropharyngeal edema.  Eyes: Pupils are equal, round, and reactive to light. EOM are normal.  Cardiovascular: Normal rate, regular rhythm and normal heart sounds.  No murmur heard. Pulmonary/Chest: Effort normal and breath sounds normal. No respiratory distress.  She has no wheezes.  Musculoskeletal: She exhibits no edema.  Lymphadenopathy:    She has no cervical adenopathy.  Neurological: She is alert and oriented to person, place, and time. No cranial nerve deficit. She exhibits normal muscle tone.  Skin: Skin is warm and dry.  Psychiatric: She has a normal mood and affect. Her behavior is normal. Judgment and thought content normal.          Assessment & Plan:  Syncope- ?vasovagal, ? Seizure activity, less likely cardiac. EKG tracing is personally reviewed. EKG notes NSR.  No acute changes.  Orthostatics are performed and are negative.  Neuro exam is normal. Obtain CMET, CBC and 2D echo.  Refer to neurology for further evaluation. I have advised pt not to drive until cleared by neurology due to unexplained syncope and to go to the ER if recurrent syncope.  Advised close follow up with PCP in 1 week.   HTN- bp is mildly elevated but I am hesitant to change her bp meds just yet in light of recent syncopal episode.    Viral  URI- Rapid strep negative. Suspect that she is recovering from an acute viral illness. Rapid strep negative.  Having some sinus congestion.  Continue zyrtec, add flonase, let us know if sinus symptoms worsen or if not improved in 3-4 days.   Snoring- refer for dental device to orthodontist. Review of chart shows that 2016 home sleep study was negative for OSA but Dr. Brett Fairy recommended dental device for snoring.

## 2017-12-22 ENCOUNTER — Telehealth: Payer: Self-pay | Admitting: Family

## 2017-12-22 ENCOUNTER — Encounter: Payer: Self-pay | Admitting: Family

## 2017-12-22 ENCOUNTER — Encounter: Payer: Self-pay | Admitting: Neurology

## 2017-12-22 LAB — COMPREHENSIVE METABOLIC PANEL
ALBUMIN: 4.2 g/dL (ref 3.5–5.2)
ALK PHOS: 47 U/L (ref 39–117)
ALT: 15 U/L (ref 0–35)
AST: 15 U/L (ref 0–37)
BILIRUBIN TOTAL: 0.4 mg/dL (ref 0.2–1.2)
BUN: 13 mg/dL (ref 6–23)
CALCIUM: 9.8 mg/dL (ref 8.4–10.5)
CO2: 27 mEq/L (ref 19–32)
CREATININE: 0.7 mg/dL (ref 0.40–1.20)
Chloride: 103 mEq/L (ref 96–112)
GFR: 118.02 mL/min (ref >60.00)
Glucose, Bld: 82 mg/dL (ref 70–99)
Potassium: 4.4 mEq/L (ref 3.5–5.1)
SODIUM: 136 meq/L (ref 135–145)
TOTAL PROTEIN: 7.6 g/dL (ref 6.0–8.3)

## 2017-12-22 LAB — CBC WITH DIFFERENTIAL/PLATELET
Basophils Absolute: 0 10*3/uL (ref 0.0–0.1)
Basophils Relative: 0.5 % (ref 0.0–3.0)
Eosinophils Absolute: 0.2 10*3/uL (ref 0.0–0.7)
Eosinophils Relative: 3.5 % (ref 0.0–5.0)
HCT: 38.3 % (ref 36.0–46.0)
Hemoglobin: 12.7 g/dL (ref 12.0–15.0)
Lymphocytes Relative: 33.9 % (ref 12.0–46.0)
Lymphs Abs: 1.9 10*3/uL (ref 0.7–4.0)
MCHC: 33.1 g/dL (ref 30.0–36.0)
MCV: 91.7 fl (ref 78.0–100.0)
Monocytes Absolute: 0.5 10*3/uL (ref 0.1–1.0)
Monocytes Relative: 9.1 % (ref 3.0–12.0)
Neutro Abs: 3 10*3/uL (ref 1.4–7.7)
Neutrophils Relative %: 53 % (ref 43.0–77.0)
Platelets: 440 10*3/uL — ABNORMAL HIGH (ref 150.0–400.0)
RBC: 4.18 Mil/uL (ref 3.87–5.11)
RDW: 14.9 % (ref 11.5–15.5)
WBC: 5.7 10*3/uL (ref 4.0–10.5)

## 2017-12-22 NOTE — Telephone Encounter (Signed)
See my chart message

## 2017-12-23 ENCOUNTER — Encounter: Payer: Self-pay | Admitting: Family

## 2017-12-23 NOTE — Telephone Encounter (Signed)
Copied from [place] [id number]. Topic: General - Other >> Dec 22, 2017  8:56 AM Yvette Rack wrote: Reason for CRM: pt calling wanting a out of work note since she cant drive until she see a neurologist  per Inda Castle

## 2017-12-28 ENCOUNTER — Encounter: Payer: Self-pay | Admitting: Family Medicine

## 2017-12-28 ENCOUNTER — Ambulatory Visit: Payer: 59 | Admitting: Family Medicine

## 2017-12-28 VITALS — BP 120/86 | HR 64 | Temp 98.7°F | Ht 67.0 in | Wt 202.4 lb

## 2017-12-28 DIAGNOSIS — E669 Obesity, unspecified: Secondary | ICD-10-CM

## 2017-12-28 MED ORDER — PHENTERMINE HCL 37.5 MG PO TABS: 37.5000 mg | ORAL_TABLET | Freq: Every day | ORAL | 1 refills | Status: DC

## 2017-12-28 NOTE — Progress Notes (Signed)
Chief Complaint  Patient presents with  . Follow-up    Subjective: Patient is a 42 y.o. female here for 1 week f/u.  She was seen 1 week ago for a syncopal episode.  Workup was negative.  She was referred to the neurology team.  She has a history of snoring and is also referred to a dentist which she has not heard anything about.  The patient has been having weight gain and difficulty losing weight.  She travels a lot for work and has difficulty eating healthfully and staying active.  She joined and after work class for [employer] but has not instilled much cardio. She is interested in a medication to help with this.  She has a conference in [place] to present at in around 6 weeks.  She would like to lose weight for this.  ROS: Heart: Denies chest pain  Lungs: Denies SOB    Past Medical History:  Diagnosis Date  . Allergy   . Depression   . Fibroids 2013  . Hypertension    no meds..pt states up last year  . Refusal of blood transfusions as patient is Jehovah's Witness 2013  . Sleep apnea 07/12/2015   Allergies  Allergen Reactions  . Amoxicillin     Red blotches    Current Outpatient Medications:  .  amLODipine (NORVASC) 5 MG tablet, Take 1 tablet (5 mg total) daily by mouth., Disp: 90 tablet, Rfl: 3 .  Turmeric POWD, Take 1 Scoop daily by mouth. Mix with hot tea and apple cider vinegar , Disp: , Rfl:  .  phentermine (ADIPEX-P) 37.5 MG tablet, Take 1 tablet (37.5 mg total) by mouth daily before breakfast., Disp: 30 tablet, Rfl: 1  Objective: BP 120/86 (BP Location: Left Arm, Patient Position: Sitting, Cuff Size: Large)   Pulse 64   Temp 98.7 F (37.1 C) (Oral)   Ht 5\' 7"  (1.702 m)   Wt 202 lb 6 oz (91.8 kg)   SpO2 99%   BMI 31.70 kg/m  General: Awake, appears stated age HEENT: MMM, EOMi Heart: RRR, no LE edema Lungs: CTAB, no rales, wheezes or rhonchi. No accessory muscle use Psych: Age appropriate judgment and insight, normal affect and  mood  Assessment and Plan: Obesity (BMI 30-39.9) - Plan: phentermine (ADIPEX-P) 37.5 MG tablet  Orders as above. Short course, <3 mo, of Adipex to help with her event preparation. Offered referral to [facility], but she declined at this time.  Counseled on diet and exercise. Ck on DDS referral for mouth guard. Fu in 1 mo. The patient voiced understanding and agreement to the plan.  Greater than 25 minutes were spent face to face with the patient with greater than 50% of this time spent counseling on referral to dentist, diet, exercise, weight loss medicine risks/benefits, goals and blood pressure.   [place], DO 12/28/17  5:12 PM

## 2017-12-28 NOTE — Patient Instructions (Addendum)
Don't take this medicine at night.  Aim to do some physical exertion for 150 minutes per week. This is typically divided into 5 days per week, 30 minutes per day. The activity should be enough to get your heart rate up. Anything is better than nothing if you have time constraints.  Keep the diet clean.  We will see about your dentist referral.  Goal weight loss: 7 lb ideal with goal of 10.   Let us know if you need anything.  Your blood pressure looks good today!

## 2017-12-28 NOTE — Progress Notes (Signed)
Pre visit review using our clinic review tool, if applicable. No additional management support is needed unless otherwise documented below in the visit note. 

## 2017-12-30 ENCOUNTER — Other Ambulatory Visit (HOSPITAL_BASED_OUTPATIENT_CLINIC_OR_DEPARTMENT_OTHER): Payer: 59

## 2018-01-01 ENCOUNTER — Ambulatory Visit (HOSPITAL_BASED_OUTPATIENT_CLINIC_OR_DEPARTMENT_OTHER): Payer: 59

## 2018-01-25 ENCOUNTER — Ambulatory Visit: Payer: 59 | Admitting: Family Medicine

## 2018-01-26 ENCOUNTER — Ambulatory Visit (HOSPITAL_BASED_OUTPATIENT_CLINIC_OR_DEPARTMENT_OTHER): Payer: 59

## 2018-01-27 ENCOUNTER — Ambulatory Visit (HOSPITAL_BASED_OUTPATIENT_CLINIC_OR_DEPARTMENT_OTHER)
Admission: RE | Admit: 2018-01-27 | Discharge: 2018-01-27 | Disposition: A | Discharge status: Home / Self Care | Payer: 59 | Source: Ambulatory Visit | Attending: Family | Admitting: Family

## 2018-01-27 DIAGNOSIS — I10 Essential (primary) hypertension: Secondary | ICD-10-CM | POA: Insufficient documentation

## 2018-01-27 DIAGNOSIS — R55 Syncope and collapse: Secondary | ICD-10-CM

## 2018-01-27 NOTE — Progress Notes (Signed)
  Echocardiogram 2D Echocardiogram has been performed.  Rachel Oconnor 01/27/2018, 9:07 AM

## 2018-01-28 ENCOUNTER — Ambulatory Visit: Payer: 59 | Admitting: Medical

## 2018-01-28 ENCOUNTER — Encounter: Payer: Self-pay | Admitting: Medical

## 2018-01-28 VITALS — BP 124/81 | HR 80 | Temp 98.8°F | Resp 16 | Ht 67.0 in | Wt 201.4 lb

## 2018-01-28 DIAGNOSIS — J301 Allergic rhinitis due to pollen: Secondary | ICD-10-CM | POA: Diagnosis not present

## 2018-01-28 DIAGNOSIS — J40 Bronchitis, not specified as acute or chronic: Secondary | ICD-10-CM

## 2018-01-28 DIAGNOSIS — R059 Cough, unspecified: Secondary | ICD-10-CM

## 2018-01-28 DIAGNOSIS — R05 Cough: Secondary | ICD-10-CM | POA: Diagnosis not present

## 2018-01-28 MED ORDER — AZITHROMYCIN 250 MG PO TABS: ORAL_TABLET | ORAL | 0 refills | Status: DC

## 2018-01-28 MED ORDER — FLUTICASONE PROPIONATE 50 MCG/ACT NA SUSP: 2.0000 | Freq: Every day | NASAL | 2 refills | Status: DC

## 2018-01-28 MED ORDER — LEVOCETIRIZINE DIHYDROCHLORIDE 5 MG PO TABS: 5.0000 mg | ORAL_TABLET | Freq: Every evening | ORAL | 3 refills | Status: DC

## 2018-01-28 MED ORDER — HYDROCODONE-HOMATROPINE 5-1.5 MG/5ML PO SYRP: 5.0000 mL | ORAL_SOLUTION | Freq: Three times a day (TID) | ORAL | 0 refills | Status: DC | PRN

## 2018-01-28 MED ORDER — MONTELUKAST SODIUM 10 MG PO TABS: 10.0000 mg | ORAL_TABLET | Freq: Every day | ORAL | 3 refills | Status: DC

## 2018-01-28 NOTE — Progress Notes (Signed)
Subjective:    Patient ID: Rachel Oconnor, female    DOB: 06/08/1976, 42 y.o.   MRN: [id number]  HPI  Pt in with severe cough for 3 weeks. She states too busy from work to come in. The cough is productive. No fever, no chills or sweats.  At onset had obvious sweats for 24 hours but then sweats stopped.  Some nasal congestion and some pnd. Pt has year round allergies. Pt is on zyrtec and flonase. Spring and summer is worse season.  Pt not sleeping due to cough. Feels some chest congestion.   Review of Systems  Constitutional: Negative for chills, fatigue and fever.  HENT: Positive for congestion, postnasal drip, sinus pressure and sneezing. Negative for sinus pain.   Respiratory: Positive for cough. Negative for chest tightness, shortness of breath and wheezing.   Cardiovascular: Negative for chest pain and palpitations.  Gastrointestinal: Negative for abdominal pain.  Skin: Negative for rash.  Neurological: Negative for dizziness, syncope, weakness and headaches.  Hematological: Negative for adenopathy. Does not bruise/bleed easily.  Psychiatric/Behavioral: Negative for behavioral problems and confusion. The patient is not nervous/anxious.     Past Medical History:  Diagnosis Date  . Allergy   . Depression   . Fibroids 2013  . Hypertension    no meds..pt states up last year  . Refusal of blood transfusions as patient is Jehovah's Witness 2013  . Sleep apnea 07/12/2015     Social History   Socioeconomic History  . Marital status: Married    Spouse name: Not on file  . Number of children: Not on file  . Years of education: Not on file  . Highest education level: Not on file  Occupational History  . Not on file  Social Needs  . Financial resource strain: Not on file  . Food insecurity:    Worry: Not on file    Inability: Not on file  . Transportation needs:    Medical: Not on file    Non-medical: Not on file  Tobacco Use  . Smoking status: Never Smoker  .  Smokeless tobacco: Never Used  Substance and Sexual Activity  . Alcohol use: Yes    Comment: wine with dinner   . Drug use: No  . Sexual activity: Yes    Partners: Male    Birth control/protection: Pill    Comment: 1st intercourse- 37, partners- 5  Lifestyle  . Physical activity:    Days per week: Not on file    Minutes per session: Not on file  . Stress: Not on file  Relationships  . Social connections:    Talks on phone: Not on file    Gets together: Not on file    Attends religious service: Not on file    Active member of club or organization: Not on file    Attends meetings of clubs or organizations: Not on file    Relationship status: Not on file  . Intimate partner violence:    Fear of current or ex partner: Not on file    Emotionally abused: Not on file    Physically abused: Not on file    Forced sexual activity: Not on file  Other Topics Concern  . Not on file  Social History Narrative  . Not on file    Past Surgical History:  Procedure Laterality Date  . ABLATION    . davinci myomectomy  04/2011  . HERNIA REPAIR    . MYOMECTOMY    . WISDOM  TOOTH EXTRACTION      Family History  Problem Relation Age of Onset  . Hypertension Mother   . Cancer Father   . Hypertension Maternal Grandmother   . Breast cancer Maternal Aunt   . Diabetes Maternal Aunt   . Breast cancer Paternal Aunt   . Cancer Paternal Aunt        colon  . Diabetes Paternal Aunt     Allergies  Allergen Reactions  . Amoxicillin     Red blotches    Current Outpatient Medications on File Prior to Visit  Medication Sig Dispense Refill  . amLODipine (NORVASC) 5 MG tablet Take 1 tablet (5 mg total) daily by mouth. 90 tablet 3  . phentermine (ADIPEX-P) 37.5 MG tablet Take 1 tablet (37.5 mg total) by mouth daily before breakfast. 30 tablet 1  . Turmeric POWD Take 1 Scoop daily by mouth. Mix with hot tea and apple cider vinegar      No current facility-administered medications on file prior to  visit.     BP 124/81   Pulse 80   Temp 98.8 F (37.1 C) (Oral)   Resp 16   Ht 5\' 7"  (1.702 m)   Wt 201 lb 6.4 oz (91.4 kg)   SpO2 100%   BMI 31.54 kg/m       Objective:   Physical Exam  General  Mental Status - Alert. General Appearance - Well groomed. Not in acute distress.  Skin Rashes- No Rashes.  HEENT Head- Normal. Ear Auditory Canal - Left- Normal. Right - Normal.Tympanic Membrane- Left- Normal. Right- Normal. Eye Sclera/Conjunctiva- Left- Normal. Right- Normal. Nose & Sinuses Nasal Mucosa- Left-  Boggy and Congested. Right-  Boggy and  Congested.Bilateral no maxillary and  No frontal sinus pressure. Mouth & Throat Lips: Upper Lip- Normal: no dryness, cracking, pallor, cyanosis, or vesicular eruption. Lower Lip-Normal: no dryness, cracking, pallor, cyanosis or vesicular eruption. Buccal Mucosa- Bilateral- No Aphthous ulcers. Oropharynx- No Discharge or Erythema. +pnd. Tonsils: Characteristics- Bilateral- No Erythema or Congestion. Size/Enlargement- Bilateral- No enlargement. Discharge- bilateral-None.  Neck Neck- Supple. No Masses.   Chest and Lung Exam Auscultation: Breath Sounds:-Clear even and unlabored.  Cardiovascular Auscultation:Rythm- Regular, rate and rhythm. Murmurs & Other Heart Sounds:Ausculatation of the heart reveal- No Murmurs.  Lymphatic Head & Neck General Head & Neck Lymphatics: Bilateral: Description- No Localized lymphadenopathy.       Assessment & Plan:  You do appear to have recent flare of your allergic rhinitis.  I did send in prescription of Xyzal and montelukast.  If you do get  Xyzal prescription recommend that she stop the Zyrtec.  Also if allergy severe enough  recommend you use Flonase.  I do also think you have some persisting bronchitis over the past 3 weeks.  I prescribed a azithromycin antibiotic and Hycodan for your cough.  Please be aware that Hycodan can cause sedation.  Follow-up in 7 to 10 days or as  needed.  Mackie Pai, PA-C

## 2018-01-28 NOTE — Patient Instructions (Addendum)
You do appear to have recent flare of your allergic rhinitis.  I did send in prescription of Xyzal and montelukast.  If you do get  Xyzal prescription recommend that she stop the Zyrtec.  Also if allergy severe enough  recommend you use Flonase.  I do also think you have some persisting bronchitis over the past 3 weeks.  I prescribed a azithromycin antibiotic and Hycodan for your cough.  Please be aware that Hycodan can cause sedation.  Follow-up in 7 to 10 days or as needed.

## 2018-01-29 ENCOUNTER — Ambulatory Visit: Payer: 59 | Admitting: Family Medicine

## 2018-02-04 ENCOUNTER — Ambulatory Visit: Payer: 59 | Admitting: Family Medicine

## 2018-03-08 ENCOUNTER — Ambulatory Visit: Payer: 59 | Admitting: Family Medicine

## 2018-03-11 ENCOUNTER — Ambulatory Visit: Payer: 59 | Admitting: Neurology

## 2018-05-21 ENCOUNTER — Ambulatory Visit: Payer: 59 | Admitting: Family Medicine

## 2018-05-21 ENCOUNTER — Encounter: Payer: Self-pay | Admitting: Family Medicine

## 2018-05-21 VITALS — BP 122/80 | HR 69 | Temp 98.6°F | Ht 67.0 in | Wt 192.0 lb

## 2018-05-21 DIAGNOSIS — I10 Essential (primary) hypertension: Secondary | ICD-10-CM

## 2018-05-21 NOTE — Patient Instructions (Addendum)
You can check your blood pressures as much or as little as you want.  Keep the diet clean. Stay active. Yoga/lifting weights is also recommended.  Very strong work with your weight loss.  Donate blood to find out your blood type for free.  Let us know if you need anything.

## 2018-05-21 NOTE — Progress Notes (Signed)
Pre visit review using our clinic review tool, if applicable. No additional management support is needed unless otherwise documented below in the visit note. 

## 2018-05-21 NOTE — Progress Notes (Signed)
Chief Complaint  Patient presents with  . Follow-up    blood pressure    Subjective Rachel Oconnor is a 42 y.o. female who presents for hypertension follow up. She does intermittently monitor home blood pressures. She is compliant with medication-amlodipine 5 mg/d. Patient has these side effects of medication: none She is adhering to a healthy diet overall. Has cut down on carbs and done intermittent fasting Current exercise: walking   Past Medical History:  Diagnosis Date  . Allergy   . Depression   . Fibroids 2013  . Hypertension    no meds..pt states up last year  . Refusal of blood transfusions as patient is Jehovah's Witness 2013  . Sleep apnea 07/12/2015    Review of Systems Cardiovascular: no chest pain Respiratory:  no shortness of breath  Exam BP 122/80 (BP Location: Left Arm, Patient Position: Sitting, Cuff Size: Normal)   Pulse 69   Temp 98.6 F (37 C) (Oral)   Ht 5\' 7"  (1.702 m)   Wt 192 lb (87.1 kg)   SpO2 99%   BMI 30.07 kg/m  General:  well developed, well nourished, in no apparent distress Heart: RRR, no bruits, no LE edema Lungs: clear to auscultation, no accessory muscle use Psych: well oriented with normal range of affect and appropriate judgment/insight  Essential hypertension  Cont Norvasc. Doing well with wt loss. OK to stop checking sugars. Counseled on diet and exercise. F/u in 6 mo for CPE. The patient voiced understanding and agreement to the plan.  [place], DO 05/21/18  2:21 PM

## 2018-06-28 ENCOUNTER — Ambulatory Visit: Payer: 59 | Admitting: Medical

## 2018-06-28 ENCOUNTER — Encounter: Payer: Self-pay | Admitting: Medical

## 2018-06-28 VITALS — BP 141/94 | HR 71 | Temp 98.1°F | Resp 16 | Ht 67.0 in | Wt 189.0 lb

## 2018-06-28 DIAGNOSIS — M25552 Pain in left hip: Secondary | ICD-10-CM

## 2018-06-28 DIAGNOSIS — M5432 Sciatica, left side: Secondary | ICD-10-CM | POA: Diagnosis not present

## 2018-06-28 MED ORDER — KETOROLAC TROMETHAMINE 60 MG/2ML IM SOLN
60.0000 mg | Freq: Once | INTRAMUSCULAR | Status: AC
  Administered 2018-06-28: 60 mg via INTRAMUSCULAR

## 2018-06-28 MED ORDER — DICLOFENAC SODIUM 75 MG PO TBEC: 75.0000 mg | DELAYED_RELEASE_TABLET | Freq: Two times a day (BID) | ORAL | 0 refills | Status: DC

## 2018-06-28 MED ORDER — CYCLOBENZAPRINE HCL 10 MG PO TABS: 10.0000 mg | ORAL_TABLET | Freq: Every day | ORAL | 0 refills | Status: DC

## 2018-06-28 NOTE — Patient Instructions (Addendum)
.  You do appear to have some left-sided sciatica type pain with the left hip pain.  Since some pain directly on exam over hip, will get a left hip x-ray today.  We will see how you do with sciatica treatment but if that pain persist will get lumbar spine x-ray.  We gave you Toradol 60 mg IM injection today.  Tomorrow afternoon can start diclofenac NSAID.  Reminder not to take any over-the-counter NSAIDs while on diclofenac.  Also prescribing Flexeril to take 1 tablet at night.  In addition I think you would benefit from over-the-counter lidocaine patch.  I applied to SI area as instructed.  I did print out back stretching exercises that you can do once your pain level decreases.  Work note written to return to work this Friday.  If any significant/high level pain by Thursday please let me know and I could send in a prescription of tramadol narcotic to help with pain control while you travel.  Follow-up in 7 to 10 days or as needed.   Back Exercises If you have pain in your back, do these exercises 2-3 times each day or as told by your doctor. When the pain goes away, do the exercises once each day, but repeat the steps more times for each exercise (do more repetitions). If you do not have pain in your back, do these exercises once each day or as told by your doctor. Exercises Single Knee to Chest  Do these steps 3-5 times in a row for each leg: 1. Lie on your back on a firm bed or the floor with your legs stretched out. 2. Bring one knee to your chest. 3. Hold your knee to your chest by grabbing your knee or thigh. 4. Pull on your knee until you feel a gentle stretch in your lower back. 5. Keep doing the stretch for 10-30 seconds. 6. Slowly let go of your leg and straighten it.  Pelvic Tilt  Do these steps 5-10 times in a row: 1. Lie on your back on a firm bed or the floor with your legs stretched out. 2. Bend your knees so they point up to the ceiling. Your feet should be flat on  the floor. 3. Tighten your lower belly (abdomen) muscles to press your lower back against the floor. This will make your tailbone point up to the ceiling instead of pointing down to your feet or the floor. 4. Stay in this position for 5-10 seconds while you gently tighten your muscles and breathe evenly.  Cat-Cow  Do these steps until your lower back bends more easily: 1. Get on your hands and knees on a firm surface. Keep your hands under your shoulders, and keep your knees under your hips. You may put padding under your knees. 2. Let your head hang down, and make your tailbone point down to the floor so your lower back is round like the back of a cat. 3. Stay in this position for 5 seconds. 4. Slowly lift your head and make your tailbone point up to the ceiling so your back hangs low (sags) like the back of a cow. 5. Stay in this position for 5 seconds.  Press-Ups  Do these steps 5-10 times in a row: 1. Lie on your belly (face-down) on the floor. 2. Place your hands near your head, about shoulder-width apart. 3. While you keep your back relaxed and keep your hips on the floor, slowly straighten your arms to raise the top half  of your body and lift your shoulders. Do not use your back muscles. To make yourself more comfortable, you may change where you place your hands. 4. Stay in this position for 5 seconds. 5. Slowly return to lying flat on the floor.  Bridges  Do these steps 10 times in a row: 1. Lie on your back on a firm surface. 2. Bend your knees so they point up to the ceiling. Your feet should be flat on the floor. 3. Tighten your butt muscles and lift your butt off of the floor until your waist is almost as high as your knees. If you do not feel the muscles working in your butt and the back of your thighs, slide your feet 1-2 inches farther away from your butt. 4. Stay in this position for 3-5 seconds. 5. Slowly lower your butt to the floor, and let your butt muscles  relax.  If this exercise is too easy, try doing it with your arms crossed over your chest. Belly Crunches  Do these steps 5-10 times in a row: 1. Lie on your back on a firm bed or the floor with your legs stretched out. 2. Bend your knees so they point up to the ceiling. Your feet should be flat on the floor. 3. Cross your arms over your chest. 4. Tip your chin a little bit toward your chest but do not bend your neck. 5. Tighten your belly muscles and slowly raise your chest just enough to lift your shoulder blades a tiny bit off of the floor. 6. Slowly lower your chest and your head to the floor.  Back Lifts Do these steps 5-10 times in a row: 1. Lie on your belly (face-down) with your arms at your sides, and rest your forehead on the floor. 2. Tighten the muscles in your legs and your butt. 3. Slowly lift your chest off of the floor while you keep your hips on the floor. Keep the back of your head in line with the curve in your back. Look at the floor while you do this. 4. Stay in this position for 3-5 seconds. 5. Slowly lower your chest and your face to the floor.  Contact a doctor if:  Your back pain gets a lot worse when you do an exercise.  Your back pain does not lessen 2 hours after you exercise. If you have any of these problems, stop doing the exercises. Do not do them again unless your doctor says it is okay. Get help right away if:  You have sudden, very bad back pain. If this happens, stop doing the exercises. Do not do them again unless your doctor says it is okay. This information is not intended to replace advice given to you by your health care provider. Make sure you discuss any questions you have with your health care provider. Document Released: 10/11/2010 Document Revised: 02/14/2016 Document Reviewed: 11/02/2014 Elsevier Interactive Patient Education  [employer].

## 2018-06-28 NOTE — Progress Notes (Signed)
Subjective:    Patient ID: Rachel Oconnor, female    DOB: [birth year], 42 y.o.   MRN: [id number]  HPI  Pt in states she woke up this past Friday and felt some pain in left si area and hip area.  Pt states not exercise before this started. No trauma of fall. No projects around the house. Pt tried ibuprofen for pain. Did not help much. Also tried tyelnol and did not help much.  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 Changing position pain worsens. States pain moderate to severe. Her boss understands can't work since driving worsens pain.  LMP- 06/22/2018.  She upcoming trip to [place] this Friday.     Review of Systems  Constitutional: Negative for chills, fatigue and fever.  Respiratory: Negative for chest tightness, shortness of breath and wheezing.   Cardiovascular: Negative for chest pain and palpitations.  Gastrointestinal: Negative for abdominal pain, nausea and vomiting.  Musculoskeletal: Positive for back pain.       Sciatica and left hip area pain  Skin: Negative for rash.  Neurological: Negative for dizziness and headaches.  Hematological: Negative for adenopathy. Does not bruise/bleed easily.  Psychiatric/Behavioral: Negative for behavioral problems and confusion.    Past Medical History:  Diagnosis Date  . Allergy   . Depression   . Fibroids 2013  . Hypertension    no meds..pt states up last year  . Refusal of blood transfusions as patient is Jehovah's Witness 2013  . Sleep apnea 07/12/2015     Social History   Socioeconomic History  . Marital status: Married    Spouse name: Not on file  . Number of children: Not on file  . Years of education: Not on file  . Highest education level: Not on file  Occupational History  . Not on file  Social Needs  . Financial resource strain: Not on file  . Food insecurity:    Worry: Not on file    Inability: Not on file  . Transportation needs:    Medical: Not on file    Non-medical:  Not on file  Tobacco Use  . Smoking status: Never Smoker  . Smokeless tobacco: Never Used  Substance and Sexual Activity  . Alcohol use: Yes    Comment: wine with dinner   . Drug use: No  . Sexual activity: Yes    Partners: Male    Birth control/protection: Pill    Comment: 1st intercourse- 31, partners- 5  Lifestyle  . Physical activity:    Days per week: Not on file    Minutes per session: Not on file  . Stress: Not on file  Relationships  . Social connections:    Talks on phone: Not on file    Gets together: Not on file    Attends religious service: Not on file    Active member of club or organization: Not on file    Attends meetings of clubs or organizations: Not on file    Relationship status: Not on file  . Intimate partner violence:    Fear of current or ex partner: Not on file    Emotionally abused: Not on file    Physically abused: Not on file    Forced sexual activity: Not on file  Other Topics Concern  . Not on file  Social History Narrative  . Not on file    Past Surgical History:  Procedure Laterality Date  . ABLATION    .  davinci myomectomy  04/2011  . HERNIA REPAIR    . MYOMECTOMY    . WISDOM TOOTH EXTRACTION      Family History  Problem Relation Age of Onset  . Hypertension Mother   . Cancer Father   . Hypertension Maternal Grandmother   . Breast cancer Maternal Aunt   . Diabetes Maternal Aunt   . Breast cancer Paternal Aunt   . Cancer Paternal Aunt        colon  . Diabetes Paternal Aunt     Allergies  Allergen Reactions  . Amoxicillin     Red blotches    Current Outpatient Medications on File Prior to Visit  Medication Sig Dispense Refill  . amLODipine (NORVASC) 5 MG tablet Take 1 tablet (5 mg total) daily by mouth. 90 tablet 3  . fluticasone (FLONASE) 50 MCG/ACT nasal spray Place 2 sprays into both nostrils daily. 16 g 2  . levocetirizine (XYZAL) 5 MG tablet Take 1 tablet (5 mg total) by mouth every evening. 30 tablet 3  .  montelukast (SINGULAIR) 10 MG tablet Take 1 tablet (10 mg total) by mouth at bedtime. 30 tablet 3  . phentermine (ADIPEX-P) 37.5 MG tablet TAKE 1 TABLET BY MOUTH BEFORE BREAKFAST  1  . Turmeric POWD Take 1 Scoop daily by mouth. Mix with hot tea and apple cider vinegar      No current facility-administered medications on file prior to visit.     BP (!) 141/94   Pulse 71   Temp 98.1 F (36.7 C) (Oral)   Resp 16   Ht 5\' 7"  (1.702 m)   Wt 189 lb (85.7 kg)   SpO2 100%   BMI 29.60 kg/m       Objective:   Physical Exam  General Appearance- Not in acute distress.    Chest and Lung Exam Auscultation: Breath sounds:-Normal. Clear even and unlabored. Adventitious sounds:- No Adventitious sounds.  Cardiovascular Auscultation:Rythm - Regular, rate and rythm. Heart Sounds -Normal heart sounds.  Abdomen Inspection:-Inspection Normal.  Palpation/Perucssion: Palpation and Percussion of the abdomen reveal- Non Tender, No Rebound tenderness, No rigidity(Guarding) and No Palpable abdominal masses.  Liver:-Normal.  Spleen:- Normal.   Back No Mid lumbar spine tenderness to palpation. Left si tenderness. Pain on straight leg lift. Pain on lateral movements and flexion/extension of the spine.  Lower ext neurologic  L5-S1 sensation intact bilaterally. Normal patellar reflexes bilaterally. No foot drop bilaterally.  Left hip- direct tender to palpation but no pain on range of motion  Skin- no rash on back or hip area.      Assessment & Plan:   You do appear to have some left-sided sciatica type pain with the left hip pain.  Since some pain directly on exam over hip, will get a left hip x-ray today.  We will see how you do with sciatica treatment but if that pain persist will get lumbar spine x-ray.  We gave you Toradol 60 mg IM injection today.  Tomorrow afternoon can start diclofenac NSAID.  Reminder not to take any over-the-counter NSAIDs while on diclofenac.  Also prescribing  Flexeril to take 1 tablet at night.  In addition I think you would benefit from over-the-counter lidocaine patch.  I applied to SI area as instructed.  I did print out back stretching exercises that you can do once your pain level decreases.  Work note written to return to work this Friday.  If any significant/high level pain by Thursday please let me know and I  could send in a prescription of tramadol narcotic to help with pain control while you travel.  Follow-up in 7 to 10 days or as needed.  Mackie Pai, PA-C

## 2018-06-30 ENCOUNTER — Other Ambulatory Visit: Payer: Self-pay | Admitting: Family Medicine

## 2018-06-30 DIAGNOSIS — Z1231 Encounter for screening mammogram for malignant neoplasm of breast: Secondary | ICD-10-CM

## 2018-07-06 ENCOUNTER — Ambulatory Visit (HOSPITAL_BASED_OUTPATIENT_CLINIC_OR_DEPARTMENT_OTHER)
Admission: RE | Admit: 2018-07-06 | Discharge: 2018-07-06 | Disposition: A | Discharge status: Home / Self Care | Payer: 59 | Source: Ambulatory Visit | Attending: Family Medicine | Admitting: Family Medicine

## 2018-07-06 ENCOUNTER — Ambulatory Visit (HOSPITAL_BASED_OUTPATIENT_CLINIC_OR_DEPARTMENT_OTHER)
Admission: RE | Admit: 2018-07-06 | Discharge: 2018-07-06 | Disposition: A | Discharge status: Home / Self Care | Payer: 59 | Source: Ambulatory Visit | Attending: Medical | Admitting: Medical

## 2018-07-06 DIAGNOSIS — Z1231 Encounter for screening mammogram for malignant neoplasm of breast: Secondary | ICD-10-CM

## 2018-07-06 DIAGNOSIS — M25552 Pain in left hip: Secondary | ICD-10-CM | POA: Insufficient documentation

## 2018-07-13 ENCOUNTER — Encounter: Payer: Self-pay | Admitting: Obstetrics & Gynecology

## 2018-07-13 ENCOUNTER — Ambulatory Visit: Payer: 59 | Admitting: Obstetrics & Gynecology

## 2018-07-13 VITALS — BP 132/88

## 2018-07-13 DIAGNOSIS — D219 Benign neoplasm of connective and other soft tissue, unspecified: Secondary | ICD-10-CM | POA: Diagnosis not present

## 2018-07-13 DIAGNOSIS — N921 Excessive and frequent menstruation with irregular cycle: Secondary | ICD-10-CM

## 2018-07-13 MED ORDER — NORETHINDRONE 0.35 MG PO TABS: 1.0000 | ORAL_TABLET | Freq: Every day | ORAL | 11 refills | Status: DC

## 2018-07-13 NOTE — Progress Notes (Signed)
    Rachel Oconnor Dec 21, 1975 [id number]        42 y.o.  G0 Married  RP: Menometrorrhagia worsening x last month  HPI: 3 episodes of moderate to heavy vaginal bleeding in the last month.  Mild bleeding currently.  H/O Endometrial ablation and Myomectomies.  Started on the Progestin pill 02/2017, ran out of it in 01/2018.  Since then, heavier and more irregular menstrual flow.  Cramping when bleeding, but no pelvic pain otherwise.  Was using natural method of contraception.  Feels tired.  Not taking iron supplements currently.   OB History  Gravida Para Term Preterm AB Living  0 0 0 0 0 0  SAB TAB Ectopic Multiple Live Births  0 0 0 0 0    Past medical history,surgical history, problem list, medications, allergies, family history and social history were all reviewed and documented in the EPIC chart.   Directed ROS with pertinent positives and negatives documented in the history of present illness/assessment and plan.  Exam:  Vitals:   07/13/18 1103  BP: 132/88   General appearance:  Normal  Abdomen: Normal  Gynecologic exam: Vulva normal.  Bimanual exam:  Uterus AV, nodular, mildly increased in volume, mobile, NT.  No adnexal mass, NT.   Assessment/Plan:  42 y.o. G0  1. Menometrorrhagia Status post endometrial ablation with recurrence of a heavier.  Since June 2018.  In the last month patient has had prolonged bleeding x3.  Will rule out anemia and endocrine dysfunctions.  Rule out pregnancy.  Patient will follow-up for pelvic ultrasound to assess the endometrial lining and the uterine fibroids.  In the meantime we will start on the progestin only pill to control the frequent heavy bleeding.  Usage risks and benefits reviewed with patient who voiced understanding and agreement.  Prescription sent to pharmacy. - CBC - TSH - Prolactin - Hemoglobin A1C - US Transvaginal Non-OB; Future - hCG, serum, qualitative  2. Fibroids Probable mild growth of uterine fibroids.  Will assess  by pelvic ultrasound at follow-up. - US Transvaginal Non-OB; Future  Other orders - norethindrone (MICRONOR,CAMILA,ERRIN) 0.35 MG tablet; Take 1 tablet (0.35 mg total) by mouth daily.  Counseling on above issues and coordination of care more than 50% for 25 minutes.  Princess Bruins MD, 11:22 AM 07/13/2018

## 2018-07-14 LAB — CBC
HEMATOCRIT: 37.4 % (ref 35.0–45.0)
Hemoglobin: 12.6 g/dL (ref 11.7–15.5)
MCH: 30.3 pg (ref 27.0–33.0)
MCHC: 33.7 g/dL (ref 32.0–36.0)
MCV: 89.9 fL (ref 80.0–100.0)
MPV: 9.1 fL (ref 7.5–12.5)
Platelets: 452 10*3/uL — ABNORMAL HIGH (ref 140–400)
RBC: 4.16 10*6/uL (ref 3.80–5.10)
RDW: 14.4 % (ref 11.0–15.0)
WBC: 4.5 10*3/uL (ref 3.8–10.8)

## 2018-07-14 LAB — HEMOGLOBIN A1C
HEMOGLOBIN A1C: 4.9 %{Hb} (ref <5.7)
Mean Plasma Glucose: 94 (calc)
eAG (mmol/L): 5.2 (calc)

## 2018-07-14 LAB — PROLACTIN: Prolactin: 33.6 ng/mL — ABNORMAL HIGH

## 2018-07-14 LAB — HCG, SERUM, QUALITATIVE: Preg, Serum: NEGATIVE

## 2018-07-14 LAB — TSH: TSH: 0.87 mIU/L

## 2018-07-16 ENCOUNTER — Encounter: Payer: Self-pay | Admitting: Obstetrics & Gynecology

## 2018-07-16 NOTE — Patient Instructions (Signed)
1. Menometrorrhagia Status post endometrial ablation with recurrence of a heavier.  Since June 2018.  In the last month patient has had prolonged bleeding x3.  Will rule out anemia and endocrine dysfunctions.  Rule out pregnancy.  Patient will follow-up for pelvic ultrasound to assess the endometrial lining and the uterine fibroids.  In the meantime we will start on the progestin only pill to control the frequent heavy bleeding.  Usage risks and benefits reviewed with patient who voiced understanding and agreement.  Prescription sent to pharmacy. - CBC - TSH - Prolactin - Hemoglobin A1C - US Transvaginal Non-OB; Future - hCG, serum, qualitative  2. Fibroids Probable mild growth of uterine fibroids.  Will assess by pelvic ultrasound at follow-up. - US Transvaginal Non-OB; Future  Other orders - norethindrone (MICRONOR,CAMILA,ERRIN) 0.35 MG tablet; Take 1 tablet (0.35 mg total) by mouth daily.  Rachel Oconnor, it was a pleasure seeing you today!  I will inform you of your results as soon as they are available.

## 2018-07-19 ENCOUNTER — Other Ambulatory Visit: Payer: Self-pay | Admitting: Obstetrics & Gynecology

## 2018-07-19 DIAGNOSIS — R7989 Other specified abnormal findings of blood chemistry: Secondary | ICD-10-CM

## 2018-07-19 DIAGNOSIS — E229 Hyperfunction of pituitary gland, unspecified: Principal | ICD-10-CM

## 2018-08-03 ENCOUNTER — Ambulatory Visit: Payer: 59 | Admitting: Obstetrics & Gynecology

## 2018-08-03 ENCOUNTER — Ambulatory Visit (INDEPENDENT_AMBULATORY_CARE_PROVIDER_SITE_OTHER): Payer: 59

## 2018-08-03 ENCOUNTER — Encounter: Payer: Self-pay | Admitting: Obstetrics & Gynecology

## 2018-08-03 ENCOUNTER — Other Ambulatory Visit: Payer: Self-pay | Admitting: Obstetrics & Gynecology

## 2018-08-03 DIAGNOSIS — D251 Intramural leiomyoma of uterus: Secondary | ICD-10-CM

## 2018-08-03 DIAGNOSIS — N921 Excessive and frequent menstruation with irregular cycle: Secondary | ICD-10-CM

## 2018-08-03 DIAGNOSIS — D252 Subserosal leiomyoma of uterus: Secondary | ICD-10-CM

## 2018-08-03 DIAGNOSIS — D219 Benign neoplasm of connective and other soft tissue, unspecified: Secondary | ICD-10-CM

## 2018-08-03 DIAGNOSIS — N939 Abnormal uterine and vaginal bleeding, unspecified: Secondary | ICD-10-CM | POA: Diagnosis not present

## 2018-08-03 DIAGNOSIS — N83201 Unspecified ovarian cyst, right side: Secondary | ICD-10-CM

## 2018-08-03 DIAGNOSIS — N852 Hypertrophy of uterus: Secondary | ICD-10-CM | POA: Diagnosis not present

## 2018-08-03 NOTE — Patient Instructions (Signed)
1. Menometrorrhagia Status post robotic myomectomies and endometrial ablation with recurrence of menometrorrhagia with heavy bleeding at times.  Patient desires a hysterectomy at this point.  Will restart and continue on the Progestin-only pill until surgery.  Will proceed with a robotic total laparoscopic hysterectomy with bilateral salpingectomy.  Information and pamphlets given.  Follow-up preop visit.  2. Fibroids Decision to proceed with Robotic TLH/Bilateral Salpingectomy.    Rachel Oconnor, it was a pleasure seeing you today!

## 2018-08-03 NOTE — Progress Notes (Addendum)
    Rachel Oconnor September 18, 1976 [id number]        42 y.o.  G0P0000 Stable partner  RP: Fibroids with menometrorrhagia for Pelvic US  HPI: Represcribed the Progestin-only pill last visit on July 13, 2018, but not restarted yet.  No current vaginal bleeding.  Feeling of pressure in the lower abdomen, but no significant pain currently.  History of myomectomies assisted with the robot and history of endometrial ablation.  Patient is at the point now of desiring hysterectomy.   OB History  Gravida Para Term Preterm AB Living  0 0 0 0 0 0  SAB TAB Ectopic Multiple Live Births  0 0 0 0 0    Past medical history,surgical history, problem list, medications, allergies, family history and social history were all reviewed and documented in the EPIC chart.   Directed ROS with pertinent positives and negatives documented in the history of present illness/assessment and plan.  Exam:  There were no vitals filed for this visit. General appearance:  Normal  Pelvic US today: T/V and T/a images.  Enlarged uterus measuring 8.07 x 8.07 x 7.38 cm.  Many subserous and intramural fibroids: 2.7 x 2.2 cm, 1.9 x 2.7 cm, 3.6 x 3.2 cm, 1.7 x 2 cm, 2.7 x 2.3 cm, on the left 5.6 x 3.2 cm and on the right 6.9 x 5.3 cm.  Right ovary with a corpus luteum cyst measuring 3.1 x 2.4 cm with color flow Doppler positive at the peri-ferry.  Left ovary normal.  Free fluid in the posterior cul-de-sac measuring 5.8 x 4.9 cm.   Assessment/Plan:  42 y.o. G0P0000   1. Menometrorrhagia Status post robotic myomectomies and endometrial ablation with recurrence of menometrorrhagia with heavy bleeding at times.  Multiple Uterine Fibroids with Menometrorrhagia with h/o Myomectomies and Endometrial Ablation. Patient desires a hysterectomy at this point.  Will restart and continue on the Progestin-only pill until surgery.  Will proceed with a robotic total laparoscopic hysterectomy with bilateral salpingectomy.  Information and pamphlets  given.  Follow-up preop visit.  2. Fibroids Multiple Uterine Fibroids with Menometrorrhagia with h/o Myomectomies and Endometrial Ablation.  Decision to proceed with Robotic TLH/Bilateral Salpingectomy.    Counseling on above issues and coordination of care more than 50% for 15 minutes.  Princess Bruins MD, 12:24 PM 08/03/2018

## 2018-08-10 ENCOUNTER — Telehealth: Payer: Self-pay

## 2018-08-10 NOTE — Telephone Encounter (Signed)
I called patient and left message to call me about unread My Chart message dated 07/19/18.  07/19/18 " Dr. Dellis Filbert reviewed your lab results and wrote "Serum pregnancy test,TSH (thyroid), HgbA1C (diabetes screen), CBC (complete blood count) are all normal. Prolactin level is mildly increased.  Recommend repeat a morning Prolactin in one month."

## 2018-08-16 NOTE — Telephone Encounter (Signed)
Patient read the My Chart message about her Prolactin result on 08/10/18.

## 2018-08-23 ENCOUNTER — Telehealth: Payer: Self-pay

## 2018-08-23 ENCOUNTER — Encounter: Payer: Self-pay | Admitting: Obstetrics & Gynecology

## 2018-08-23 ENCOUNTER — Ambulatory Visit: Payer: 59 | Admitting: Obstetrics & Gynecology

## 2018-08-23 ENCOUNTER — Other Ambulatory Visit: Payer: Self-pay | Admitting: Family Medicine

## 2018-08-23 VITALS — BP 154/92

## 2018-08-23 DIAGNOSIS — N921 Excessive and frequent menstruation with irregular cycle: Secondary | ICD-10-CM

## 2018-08-23 DIAGNOSIS — D219 Benign neoplasm of connective and other soft tissue, unspecified: Secondary | ICD-10-CM | POA: Diagnosis not present

## 2018-08-23 NOTE — Telephone Encounter (Signed)
Copied from [place] [id number]. Topic: Quick Communication - Rx Refill/Question >> Aug 23, 2018  5:21 PM Cecelia Byars, NT wrote: Medication: amLODipine (NORVASC) 5 MG tablet  Has the patient contacted their pharmacy? yes  (Agent: If no, request that the patient contact the pharmacy for the refill. (Agent: If yes, when and what did the pharmacy advise? Told patient to call the office  [facility] (with phone number or street name [place], [place] [phone] (Phone) [phone] (Fax    Agent: Please be advised that RX refills may take up to 3 business days. We ask that you follow-up with your pharmacy.

## 2018-08-23 NOTE — Patient Instructions (Signed)
1. Menometrorrhagia Irregular heavy menses associated with many uterine fibroids.  H/O Myomectomies and Endometrial Ablation.  Will proceed with XI TLH/Bilateral Salpingectomy.  Surgery and risks as well as preop and postop care thoroughly reviewed.  Will see her Fam MD to better control her BP prior to surgery.  Low salt diet.  Start Progestin-only pill to avoid heavy menses prior to surgery.  [name] witness, refuses blood transfusions.    2. Fibroids As above                        Patient was counseled as to the risk of surgery to include the following:  1. Infection (prohylactic antibiotics will be administered)  2. DVT/Pulmonary Embolism (prophylactic pneumo compression stockings will be used)  3.Trauma to internal organs requiring additional surgical procedure to repair any injury to internal organs requiring perhaps additional hospitalization days.  4.Hemmorhage requiring transfusion of expanders, as patient refuses blood transfusions, discussed.  Patient had received literature information on the procedure scheduled and all her questions were answered and fully accepts all risk.  [name], it was a pleasure seeing you today!

## 2018-08-23 NOTE — Telephone Encounter (Signed)
I received surgery order for patient today for Robotic surgery. I called to let her know that I am waiting on the Feb schedule to be able to schedule robotic surgery. I did let her know that she is 5th robotic case in line. Hopefully can get everyone in in Feb but I will confirm with her once I get Feb schedule and start scheduling patients.

## 2018-08-23 NOTE — Progress Notes (Signed)
    Rachel Oconnor [birth year] [id number]        42 y.o.  G0 Stable boyfriend  RP: Preop XI Robotic TLH/Bilateral Salpingectomy  HPI: Still didn't start the Progestin-only pill, will pick-up prescription today.  Mild spotting on-off with pelvic cramping.  BP 154/92 on Norvasc 5 mg daily, will see Fam MD to better control her BP prior to surgery.   Feeling of pressure in the lower abdomen, but no significant pain currently.  History of myomectomies assisted with the robot and history of endometrial ablation.  Patient is at the point now of desiring hysterectomy.    OB History  Gravida Para Term Preterm AB Living  0 0 0 0 0 0  SAB TAB Ectopic Multiple Live Births  0 0 0 0 0    Past medical history,surgical history, problem list, medications, allergies, family history and social history were all reviewed and documented in the EPIC chart.   Directed ROS with pertinent positives and negatives documented in the history of present illness/assessment and plan.  Exam:  Vitals:   08/23/18 0811  BP: (!) 154/92   General appearance:  Normal  Pelvic US 08/03/2018: T/V and T/a images. Enlarged uterus measuring 8.07 x 8.07 x 7.38 cm. Many subserous and intramural fibroids: 2.7 x 2.2 cm, 1.9 x 2.7 cm, 3.6 x 3.2 cm, 1.7 x 2 cm, 2.7 x 2.3 cm, on the left 5.6 x 3.2 cm and on the right 6.9 x 5.3 cm. Right ovary with a corpus luteum cyst measuring 3.1 x 2.4 cm with color flow Doppler positive at the peri-ferry. Left ovary normal. Free fluid in the posterior cul-de-sac measuring 5.8 x 4.9 cm.  Hb 07/13/2018: 12.6   Assessment/Plan:  42 y.o. G0  1. Menometrorrhagia Irregular heavy menses associated with many uterine fibroids.  H/O Myomectomies and Endometrial Ablation.  Will proceed with XI TLH/Bilateral Salpingectomy.  Surgery and risks as well as preop and postop care thoroughly reviewed.  Will see her Fam MD to better control her BP prior to surgery.  Low salt diet.  Start Progestin-only pill  to avoid heavy menses prior to surgery.  [place] witness, refuses blood transfusions.    2. Fibroids As above                        Patient was counseled as to the risk of surgery to include the following:  1. Infection (prohylactic antibiotics will be administered)  2. DVT/Pulmonary Embolism (prophylactic pneumo compression stockings will be used)  3.Trauma to internal organs requiring additional surgical procedure to repair any injury to internal organs requiring perhaps additional hospitalization days.  4.Hemmorhage requiring transfusion of expanders, as patient refuses blood transfusions, discussed.  Patient had received literature information on the procedure scheduled and all her questions were answered and fully accepts all risk.  Counseling on above issues and coordination of care more than 50% for 25 minutes.  Princess Bruins MD, 8:15 AM 08/23/2018

## 2018-08-24 NOTE — Telephone Encounter (Signed)
Medication refilled on 08/24/18

## 2018-08-31 ENCOUNTER — Telehealth: Payer: Self-pay

## 2018-08-31 NOTE — Telephone Encounter (Signed)
I spoke with patient to let her know that I now have Feb schedule. I scheduled her surgery for 11/16/18 and she knows time may be subject to change but will be in the a.m.  We reviewed her 2019 insurance benefits and she says they will remain the same with 2020. I advised her of her estimated surgery prepymt amount that will be due by one week prior to surgery.  I will mail her a [facility] pamphlet as well as a Ambulance person.

## 2018-09-24 DIAGNOSIS — I10 Essential (primary) hypertension: Secondary | ICD-10-CM | POA: Diagnosis not present

## 2018-09-24 DIAGNOSIS — R252 Cramp and spasm: Secondary | ICD-10-CM | POA: Diagnosis not present

## 2018-09-24 DIAGNOSIS — M7061 Trochanteric bursitis, right hip: Secondary | ICD-10-CM | POA: Diagnosis not present

## 2018-09-30 ENCOUNTER — Telehealth: Payer: Self-pay

## 2018-09-30 NOTE — Telephone Encounter (Signed)
I called patient because I rechecked her 2020 ins benefits for her upcoming surgery. She had some changes in benefits and I advised her of those. It changed her estimated prepymt amount. I made her aware of that amount and that I will be sending her an updated financial letter.

## 2018-10-07 ENCOUNTER — Telehealth: Payer: Self-pay

## 2018-10-07 NOTE — Telephone Encounter (Signed)
I called patient to inform her that Dr. Marguerita Merles would like to see her for a pre op appt prior to surgery on 11/16/2018.  Patient understands. Butch Penny will be calling her back to schedule appt.

## 2018-10-22 DIAGNOSIS — Z6830 Body mass index (BMI) 30.0-30.9, adult: Secondary | ICD-10-CM | POA: Diagnosis not present

## 2018-10-22 DIAGNOSIS — I10 Essential (primary) hypertension: Secondary | ICD-10-CM | POA: Diagnosis not present

## 2018-10-29 DIAGNOSIS — I10 Essential (primary) hypertension: Secondary | ICD-10-CM | POA: Diagnosis not present

## 2018-11-01 DIAGNOSIS — Z0289 Encounter for other administrative examinations: Secondary | ICD-10-CM

## 2018-11-04 ENCOUNTER — Ambulatory Visit: Payer: 59 | Admitting: Obstetrics & Gynecology

## 2018-11-04 ENCOUNTER — Encounter: Payer: Self-pay | Admitting: Obstetrics & Gynecology

## 2018-11-04 VITALS — BP 140/90

## 2018-11-04 DIAGNOSIS — D219 Benign neoplasm of connective and other soft tissue, unspecified: Secondary | ICD-10-CM

## 2018-11-04 DIAGNOSIS — N921 Excessive and frequent menstruation with irregular cycle: Secondary | ICD-10-CM | POA: Diagnosis not present

## 2018-11-04 NOTE — Patient Instructions (Signed)
1. Menometrorrhagia Irregular heavy menses associated with many uterine fibroids.  H/O Myomectomies and Endometrial Ablation.  Will proceed with XI TLH/Bilateral Salpingectomy.  Surgery and risks as well as preop and postop care thoroughly reviewed.  Seen by Heriberto Antigua MD to manage BP, on Norvasc 5 mg daily.  Low salt diet. On Progestin-only pill to avoid heavy menses prior to surgery.  [place] witness, refuses blood transfusions.    2. Fibroids As above                        Patient was counseled as to the risk of surgery to include the following:  1. Infection (prohylactic antibiotics will be administered)  2. DVT/Pulmonary Embolism (prophylactic pneumo compression stockings will be used)  3.Trauma to internal organs requiring additional surgical procedure to repair any injury to internal organs requiring perhaps additional hospitalization days.  4.Hemmorhage requiring transfusion of expanders, as patient refuses blood transfusions, discussed.  Patient had received literature information on the procedure scheduled and all her questions were answered and fully accepts all risk.  Sabiha, good seeing you today!

## 2018-11-04 NOTE — Progress Notes (Signed)
    Rachel Oconnor 1976/07/02 [id number]        43 y.o.  G0 Stable boyfriend  RP: Preop XI Robotic TLH/Bilateral Salpingectomy  HPI: Well on Progestin-only pill.  No BTB, no pelvic pain.  BP today better at 140/90 on Norvasc 5 mg daily, seen by Fam MD. Feeling of pressure in the lower abdomen, but no significant pain currently. History of myomectomies assisted with the robot and history of endometrial ablation. Patient is at the point now of desiring hysterectomy.    OB History  Gravida Para Term Preterm AB Living  0 0 0 0 0 0  SAB TAB Ectopic Multiple Live Births  0 0 0 0 0    Past medical history,surgical history, problem list, medications, allergies, family history and social history were all reviewed and documented in the EPIC chart.   Directed ROS with pertinent positives and negatives documented in the history of present illness/assessment and plan.  Exam:  Vitals:   11/04/18 0934  BP: 140/90   General appearance:  Normal  Pelvic US 08/03/2018: T/V and T/a images. Enlarged uterus measuring 8.07 x 8.07 x 7.38 cm. Many subserous and intramural fibroids: 2.7 x 2.2 cm, 1.9 x 2.7 cm, 3.6 x 3.2 cm, 1.7 x 2 cm, 2.7 x 2.3 cm, on the left 5.6 x 3.2 cm and on the right 6.9 x 5.3 cm. Right ovary with a corpus luteum cyst measuring 3.1 x 2.4 cm with color flow Doppler positive at the peri-ferry. Left ovary normal. Free fluid in the posterior cul-de-sac measuring 5.8 x 4.9 cm.  Hb 07/13/2018: 12.6   Assessment/Plan:  43 y.o. G0  1. Menometrorrhagia Irregular heavy menses associated with many uterine fibroids.  H/O Myomectomies and Endometrial Ablation.  Will proceed with XI TLH/Bilateral Salpingectomy.  Surgery and risks as well as preop and postop care thoroughly reviewed.  Seen by Heriberto Antigua MD to manage BP, on Norvasc 5 mg daily.  Low salt diet. On Progestin-only pill to avoid heavy menses prior to surgery.  [place] witness, refuses blood transfusions.    2. Fibroids As  above                        Patient was counseled as to the risk of surgery to include the following:  1. Infection (prohylactic antibiotics will be administered)  2. DVT/Pulmonary Embolism (prophylactic pneumo compression stockings will be used)  3.Trauma to internal organs requiring additional surgical procedure to repair any injury to internal organs requiring perhaps additional hospitalization days.  4.Hemmorhage requiring transfusion of expanders, as patient refuses blood transfusions, discussed.  Patient had received literature information on the procedure scheduled and all her questions were answered and fully accepts all risk.  Princess Bruins MD, 9:40 AM 11/04/2018

## 2018-11-10 ENCOUNTER — Encounter: Payer: Self-pay | Admitting: Anesthesiology

## 2018-11-10 NOTE — Patient Instructions (Addendum)
Rachel Oconnor  11/10/2018      Your procedure is scheduled on 11-16-2018  Report to [place]  at  [place].M.  Call this number if you have problems the morning of surgery:[phone]  OUR ADDRESS IS [address], WE ARE LOCATED IN THE [facility]ICAL PLA[facility] WITH [facility].   Remember:  NO SOLID FOOD AFTER MIDNIGHT THE NIGHT PRIOR TO SURGERY. NOTHING BY MOUTH EXCEPT CLEAR LIQUIDS UNTIL 3 HOURS PRIOR TO [place] SURGERY. PLEASE FINISH ENSURE DRINK PER SURGEON ORDER 3 HOURS PRIOR TO SCHEDULED SURGERY TIME WHICH NEEDS TO BE COMPLETED AT 530AM    CLEAR LIQUID DIET   Foods Allowed                                                                     Foods Excluded  Coffee and tea, regular and decaf                             liquids that you cannot  Plain Jell-O in any flavor                                             see through such as: Fruit ices (not with fruit pulp)                                     milk, soups, orange juice  Iced Popsicles                                    All solid food Carbonated beverages, regular and diet                                    Cranberry, grape and apple juices Sports drinks like Gatorade Lightly seasoned clear broth or consume(fat free) Sugar, honey syrup  Sample Menu Breakfast                                Lunch                                     Supper Cranberry juice                    Beef broth                            Chicken broth Jell-O  Grape juice                           Apple juice Coffee or tea                        Jell-O                                      Popsicle                                                Coffee or tea                        Coffee or tea  _____________________________________________________________________    Take these medicines the morning of surgery with A SIP OF WATER: NONE   Do not wear jewelry, make-up or nail  polish.  Do not wear lotions, powders, or perfumes, or deoderant.  Do not shave 48 hours prior to surgery.  Men may shave face and neck.  Do not bring valuables to the hospital.  [facility] is not responsible for any belongings or valuables.  Contacts, dentures or bridgework may not be worn into surgery.  Leave your suitcase in the car.  After surgery it may be brought to your room.  For patients admitted to the hospital, discharge time will be determined by your treatment team.  Patients discharged the day of surgery will not be allowed to drive home.   Special instructions:  PLEASE BRING ALL YOUR PRESCRIPTION MEDICATIONS IN ORIGINAL CONTAINERS  Please read over the following fact sheets that you were given:   [facility] - Preparing for Surgery Before surgery, you can play an important role.  Because skin is not sterile, your skin needs to be as free of germs as possible.  You can reduce the number of germs on your skin by washing with CHG (chlorahexidine gluconate) soap before surgery.  CHG is an antiseptic cleaner which kills germs and bonds with the skin to continue killing germs even after washing. Please DO NOT use if you have an allergy to CHG or antibacterial soaps.  If your skin becomes reddened/irritated stop using the CHG and inform your nurse when you arrive at Short Stay. Do not shave (including legs and underarms) for at least 48 hours prior to the first CHG shower.  You may shave your face/neck. Please follow these instructions carefully:  1.  Shower with CHG Soap the night before surgery and the  morning of Surgery.  2.  If you choose to wash your hair, wash your hair first as usual with your  normal  shampoo.  3.  After you shampoo, rinse your hair and body thoroughly to remove the  shampoo.                           4.  Use CHG as you would any other liquid soap.  You can apply chg directly  to the skin and wash                       Gently with a scrungie or clean  washcloth.  5.  Apply the CHG Soap to your body ONLY FROM THE NECK DOWN.   Do not use on face/ open                           Wound or open sores. Avoid contact with eyes, ears mouth and genitals (private parts).                       Wash face,  Genitals (private parts) with your normal soap.             6.  Wash thoroughly, paying special attention to the area where your surgery  will be performed.  7.  Thoroughly rinse your body with warm water from the neck down.  8.  DO NOT shower/wash with your normal soap after using and rinsing off  the CHG Soap.                9.  Pat yourself dry with a clean towel.            10.  Wear clean pajamas.            11.  Place clean sheets on your bed the night of your first shower and do not  sleep with pets. Day of Surgery : Do not apply any lotions/deodorants the morning of surgery.  Please wear clean clothes to the hospital/surgery center.  FAILURE TO FOLLOW THESE INSTRUCTIONS MAY RESULT IN THE CANCELLATION OF YOUR SURGERY PATIENT SIGNATURE_________________________________  NURSE SIGNATURE__________________________________  ________________________________________________________________________   Rachel Oconnor  An incentive spirometer is a tool that can help keep your lungs clear and active. This tool measures how well you are filling your lungs with each breath. Taking long deep breaths may help reverse or decrease the chance of developing breathing (pulmonary) problems (especially infection) following:  A long period of time when you are unable to move or be active. BEFORE THE PROCEDURE   If the spirometer includes an indicator to show your best effort, your nurse or respiratory therapist will set it to a desired goal.  If possible, sit up straight or lean slightly forward. Try not to slouch.  Hold the incentive spirometer in an upright position. INSTRUCTIONS FOR USE  1. Sit on the edge of your bed if possible, or sit up as far  as you can in bed or on a chair. 2. Hold the incentive spirometer in an upright position. 3. Breathe out normally. 4. Place the mouthpiece in your mouth and seal your lips tightly around it. 5. Breathe in slowly and as deeply as possible, raising the piston or the ball toward the top of the column. 6. Hold your breath for 3-5 seconds or for as long as possible. Allow the piston or ball to fall to the bottom of the column. 7. Remove the mouthpiece from your mouth and breathe out normally. 8. Rest for a few seconds and repeat Steps 1 through 7 at least 10 times every 1-2 hours when you are awake. Take your time and take a few normal breaths between deep breaths. 9. The spirometer may include an indicator to show your best effort. Use the indicator as a goal to work toward during each repetition. 10. After each set of 10 deep breaths, practice coughing to be sure your lungs are clear. If you have an incision (the cut made at the time of surgery), support your incision when coughing by  placing a pillow or rolled up towels firmly against it. Once you are able to get out of bed, walk around indoors and cough well. You may stop using the incentive spirometer when instructed by your caregiver.  RISKS AND COMPLICATIONS  Take your time so you do not get dizzy or light-headed.  If you are in pain, you may need to take or ask for pain medication before doing incentive spirometry. It is harder to take a deep breath if you are having pain. AFTER USE  Rest and breathe slowly and easily.  It can be helpful to keep track of a log of your progress. Your caregiver can provide you with a simple table to help with this. If you are using the spirometer at home, follow these instructions: [place] IF:   You are having difficultly using the spirometer.  You have trouble using the spirometer as often as instructed.  Your pain medication is not giving enough relief while using the spirometer.  You  develop fever of 100.5 F (38.1 C) or higher. SEEK IMMEDIATE MEDICAL CARE IF:   You cough up bloody sputum that had not been present before.  You develop fever of 102 F (38.9 C) or greater.  You develop worsening pain at or near the incision site. MAKE SURE YOU:   Understand these instructions.  Will watch your condition.  Will get help right away if you are not doing well or get worse. Document Released: 01/19/2007 Document Revised: 12/01/2011 Document Reviewed: 03/22/2007 [facility] Patient Information 2014 [place], [place].   ________________________________________________________________________

## 2018-11-10 NOTE — Progress Notes (Signed)
EKG 12-21-17 Epic  ECHO 04-26-18 EPIC

## 2018-11-11 ENCOUNTER — Other Ambulatory Visit: Payer: Self-pay

## 2018-11-11 ENCOUNTER — Encounter (HOSPITAL_COMMUNITY): Payer: Self-pay

## 2018-11-11 ENCOUNTER — Encounter (HOSPITAL_COMMUNITY)
Admission: RE | Admit: 2018-11-11 | Discharge: 2018-11-11 | Disposition: A | Discharge status: Home / Self Care | Payer: 59 | Source: Ambulatory Visit | Attending: Obstetrics & Gynecology | Admitting: Obstetrics & Gynecology

## 2018-11-11 DIAGNOSIS — Z01812 Encounter for preprocedural laboratory examination: Secondary | ICD-10-CM | POA: Insufficient documentation

## 2018-11-11 DIAGNOSIS — N921 Excessive and frequent menstruation with irregular cycle: Secondary | ICD-10-CM | POA: Diagnosis not present

## 2018-11-11 DIAGNOSIS — D259 Leiomyoma of uterus, unspecified: Secondary | ICD-10-CM | POA: Diagnosis not present

## 2018-11-11 HISTORY — DX: Sleep apnea, unspecified: G47.30

## 2018-11-11 LAB — BASIC METABOLIC PANEL
Anion gap: 6 (ref 5–15)
BUN: 16 mg/dL (ref 6–20)
CO2: 25 mmol/L (ref 22–32)
Calcium: 9.1 mg/dL (ref 8.9–10.3)
Chloride: 108 mmol/L (ref 98–111)
Creatinine, Ser: 0.8 mg/dL (ref 0.44–1.00)
GFR calc Af Amer: >60 mL/min (ref >60)
GFR calc non Af Amer: >60 mL/min (ref >60)
Glucose, Bld: 90 mg/dL (ref 70–99)
Potassium: 4 mmol/L (ref 3.5–5.1)
Sodium: 139 mmol/L (ref 135–145)

## 2018-11-11 LAB — CBC
HCT: 39.3 % (ref 36.0–46.0)
Hemoglobin: 12.4 g/dL (ref 12.0–15.0)
MCH: 30.5 pg (ref 26.0–34.0)
MCHC: 31.6 g/dL (ref 30.0–36.0)
MCV: 96.8 fL (ref 80.0–100.0)
Platelets: 423 10*3/uL — ABNORMAL HIGH (ref 150–400)
RBC: 4.06 MIL/uL (ref 3.87–5.11)
RDW: 14.4 % (ref 11.5–15.5)
WBC: 6.8 10*3/uL (ref 4.0–10.5)
nRBC: 0 % (ref 0.0–0.2)

## 2018-11-11 LAB — NO BLOOD PRODUCTS

## 2018-11-11 NOTE — Progress Notes (Signed)
Blood refusal faxed to dr Holley Dexter and [facility], fax confirmations received and placed on patient chart

## 2018-11-15 ENCOUNTER — Encounter (HOSPITAL_BASED_OUTPATIENT_CLINIC_OR_DEPARTMENT_OTHER): Payer: Self-pay | Admitting: Anesthesiology

## 2018-11-15 NOTE — Anesthesia Preprocedure Evaluation (Addendum)
Anesthesia Evaluation  Patient identified by MRN, date of birth, ID band Patient awake    Reviewed: Allergy & Precautions, NPO status , Patient's Chart, lab work & pertinent test results  Airway Mallampati: II  TM Distance: >3 FB Neck ROM: Full    Dental  (+) Teeth Intact   Pulmonary sleep apnea ,    Pulmonary exam normal breath sounds clear to auscultation       Cardiovascular hypertension, Pt. on medications Normal cardiovascular exam Rhythm:Regular Rate:Normal     Neuro/Psych PSYCHIATRIC DISORDERS Depression negative neurological ROS     GI/Hepatic negative GI ROS, Neg liver ROS,   Endo/Other    Renal/GU negative Renal ROS  negative genitourinary   Musculoskeletal negative musculoskeletal ROS (+)   Abdominal   Peds  Hematology  (+) REFUSES BLOOD PRODUCTS, JEHOVAH'S WITNESS  Anesthesia Other Findings   Reproductive/Obstetrics Uterine fibroids Menometrorrhagia                              Anesthesia Physical Anesthesia Plan  ASA: II  Anesthesia Plan: General   Post-op Pain Management:    Induction: Intravenous  PONV Risk Score and Plan: 4 or greater and Scopolamine patch - Pre-op, Midazolam, Dexamethasone, Ondansetron and Treatment may vary due to age or medical condition  Airway Management Planned: Oral ETT  Additional Equipment:   Intra-op Plan:   Post-operative Plan: Extubation in OR  Informed Consent: I have reviewed the patients History and Physical, chart, labs and discussed the procedure including the risks, benefits and alternatives for the proposed anesthesia with the patient or authorized representative who has indicated his/her understanding and acceptance.     Dental advisory given  Plan Discussed with: CRNA and Surgeon  Anesthesia Plan Comments:        Anesthesia Quick Evaluation

## 2018-11-16 ENCOUNTER — Ambulatory Visit (HOSPITAL_BASED_OUTPATIENT_CLINIC_OR_DEPARTMENT_OTHER)
Admission: RE | Admit: 2018-11-16 | Discharge: 2018-11-17 | Disposition: A | Discharge status: Home / Self Care | Payer: 59 | Attending: Obstetrics & Gynecology | Admitting: Obstetrics & Gynecology

## 2018-11-16 ENCOUNTER — Ambulatory Visit (HOSPITAL_BASED_OUTPATIENT_CLINIC_OR_DEPARTMENT_OTHER): Payer: 59 | Admitting: Physician Assistant

## 2018-11-16 ENCOUNTER — Encounter (HOSPITAL_BASED_OUTPATIENT_CLINIC_OR_DEPARTMENT_OTHER)
Admission: RE | Disposition: A | Discharge status: Home / Self Care | Payer: Self-pay | Source: Home / Self Care | Attending: Obstetrics & Gynecology

## 2018-11-16 ENCOUNTER — Ambulatory Visit (HOSPITAL_BASED_OUTPATIENT_CLINIC_OR_DEPARTMENT_OTHER): Payer: 59 | Admitting: Anesthesiology

## 2018-11-16 ENCOUNTER — Encounter (HOSPITAL_BASED_OUTPATIENT_CLINIC_OR_DEPARTMENT_OTHER): Payer: Self-pay

## 2018-11-16 DIAGNOSIS — Z880 Allergy status to penicillin: Secondary | ICD-10-CM | POA: Insufficient documentation

## 2018-11-16 DIAGNOSIS — N803 Endometriosis of pelvic peritoneum: Secondary | ICD-10-CM | POA: Insufficient documentation

## 2018-11-16 DIAGNOSIS — N921 Excessive and frequent menstruation with irregular cycle: Secondary | ICD-10-CM | POA: Diagnosis not present

## 2018-11-16 DIAGNOSIS — Z791 Long term (current) use of non-steroidal anti-inflammatories (NSAID): Secondary | ICD-10-CM | POA: Diagnosis not present

## 2018-11-16 DIAGNOSIS — Z79899 Other long term (current) drug therapy: Secondary | ICD-10-CM | POA: Insufficient documentation

## 2018-11-16 DIAGNOSIS — I10 Essential (primary) hypertension: Secondary | ICD-10-CM | POA: Diagnosis not present

## 2018-11-16 DIAGNOSIS — Z9889 Other specified postprocedural states: Secondary | ICD-10-CM

## 2018-11-16 DIAGNOSIS — D259 Leiomyoma of uterus, unspecified: Secondary | ICD-10-CM | POA: Diagnosis not present

## 2018-11-16 DIAGNOSIS — N920 Excessive and frequent menstruation with regular cycle: Secondary | ICD-10-CM | POA: Insufficient documentation

## 2018-11-16 DIAGNOSIS — N736 Female pelvic peritoneal adhesions (postinfective): Secondary | ICD-10-CM | POA: Insufficient documentation

## 2018-11-16 HISTORY — PX: ROBOTIC ASSISTED LAPAROSCOPIC HYSTERECTOMY AND SALPINGECTOMY: SHX6379

## 2018-11-16 LAB — POCT PREGNANCY, URINE: Preg Test, Ur: NEGATIVE

## 2018-11-16 SURGERY — XI ROBOTIC ASSISTED LAPAROSCOPIC HYSTERECTOMY AND SALPINGECTOMY
Anesthesia: General | Laterality: Bilateral

## 2018-11-16 MED ORDER — OXYCODONE-ACETAMINOPHEN 5-325 MG PO TABS
ORAL_TABLET | ORAL | Status: AC
  Filled 2018-11-16: qty 2

## 2018-11-16 MED ORDER — DEXAMETHASONE SODIUM PHOSPHATE 10 MG/ML IJ SOLN
INTRAMUSCULAR | Status: AC
  Filled 2018-11-16: qty 1

## 2018-11-16 MED ORDER — FENTANYL CITRATE (PF) 100 MCG/2ML IJ SOLN
INTRAMUSCULAR | Status: DC | PRN
  Administered 2018-11-16: 100 ug via INTRAVENOUS
  Administered 2018-11-16 (×3): 50 ug via INTRAVENOUS

## 2018-11-16 MED ORDER — MIDAZOLAM HCL 2 MG/2ML IJ SOLN
INTRAMUSCULAR | Status: DC | PRN
  Administered 2018-11-16: 2 mg via INTRAVENOUS

## 2018-11-16 MED ORDER — SCOPOLAMINE 1 MG/3DAYS TD PT72
1.0000 | MEDICATED_PATCH | TRANSDERMAL | Status: DC
  Administered 2018-11-16: 1.5 mg via TRANSDERMAL
  Filled 2018-11-16: qty 1

## 2018-11-16 MED ORDER — FENTANYL CITRATE (PF) 100 MCG/2ML IJ SOLN
INTRAMUSCULAR | Status: AC
  Filled 2018-11-16: qty 2

## 2018-11-16 MED ORDER — FENTANYL CITRATE (PF) 100 MCG/2ML IJ SOLN
25.0000 ug | INTRAMUSCULAR | Status: DC | PRN
  Administered 2018-11-16 (×2): 50 ug via INTRAVENOUS
  Filled 2018-11-16: qty 1

## 2018-11-16 MED ORDER — PROPOFOL 10 MG/ML IV BOLUS
INTRAVENOUS | Status: DC | PRN
  Administered 2018-11-16: 200 mg via INTRAVENOUS

## 2018-11-16 MED ORDER — FENTANYL CITRATE (PF) 250 MCG/5ML IJ SOLN
INTRAMUSCULAR | Status: AC
  Filled 2018-11-16: qty 5

## 2018-11-16 MED ORDER — LIDOCAINE 2% (20 MG/ML) 5 ML SYRINGE
INTRAMUSCULAR | Status: AC
  Filled 2018-11-16: qty 5

## 2018-11-16 MED ORDER — GABAPENTIN 300 MG PO CAPS
300.0000 mg | ORAL_CAPSULE | ORAL | Status: AC
  Administered 2018-11-16: 300 mg via ORAL
  Filled 2018-11-16: qty 1

## 2018-11-16 MED ORDER — HYDROMORPHONE HCL 1 MG/ML IJ SOLN
INTRAMUSCULAR | Status: AC
  Filled 2018-11-16: qty 1

## 2018-11-16 MED ORDER — METOCLOPRAMIDE HCL 5 MG/ML IJ SOLN
10.0000 mg | Freq: Once | INTRAMUSCULAR | Status: DC | PRN
  Filled 2018-11-16: qty 2

## 2018-11-16 MED ORDER — LIDOCAINE 2% (20 MG/ML) 5 ML SYRINGE
INTRAMUSCULAR | Status: DC | PRN
  Administered 2018-11-16: 1 mg/kg/h via INTRAVENOUS
  Administered 2018-11-16: 1.5 mg/kg/h via INTRAVENOUS

## 2018-11-16 MED ORDER — MIDAZOLAM HCL 2 MG/2ML IJ SOLN
INTRAMUSCULAR | Status: AC
  Filled 2018-11-16: qty 2

## 2018-11-16 MED ORDER — KETAMINE HCL 10 MG/ML IJ SOLN
INTRAMUSCULAR | Status: AC
  Filled 2018-11-16: qty 1

## 2018-11-16 MED ORDER — OXYCODONE HCL 5 MG/5ML PO SOLN
5.0000 mg | Freq: Once | ORAL | Status: DC | PRN
  Filled 2018-11-16: qty 5

## 2018-11-16 MED ORDER — IBUPROFEN 200 MG PO TABS
ORAL_TABLET | ORAL | Status: AC
  Filled 2018-11-16: qty 4

## 2018-11-16 MED ORDER — KETOROLAC TROMETHAMINE 30 MG/ML IJ SOLN
INTRAMUSCULAR | Status: AC
  Filled 2018-11-16: qty 1

## 2018-11-16 MED ORDER — SCOPOLAMINE 1 MG/3DAYS TD PT72
MEDICATED_PATCH | TRANSDERMAL | Status: AC
  Filled 2018-11-16: qty 1

## 2018-11-16 MED ORDER — OXYCODONE-ACETAMINOPHEN 5-325 MG PO TABS
1.0000 | ORAL_TABLET | ORAL | Status: DC | PRN
  Administered 2018-11-16 – 2018-11-17 (×5): 2 via ORAL
  Filled 2018-11-16: qty 2

## 2018-11-16 MED ORDER — SUGAMMADEX SODIUM 200 MG/2ML IV SOLN
INTRAVENOUS | Status: DC | PRN
  Administered 2018-11-16: 200 mg via INTRAVENOUS

## 2018-11-16 MED ORDER — KETAMINE HCL 10 MG/ML IJ SOLN
INTRAMUSCULAR | Status: DC | PRN
  Administered 2018-11-16 (×2): 20 mg via INTRAVENOUS

## 2018-11-16 MED ORDER — HYDROCHLOROTHIAZIDE 25 MG PO TABS
25.0000 mg | ORAL_TABLET | Freq: Every day | ORAL | Status: DC
  Administered 2018-11-16: 25 mg via ORAL
  Filled 2018-11-16: qty 1

## 2018-11-16 MED ORDER — OXYCODONE HCL 5 MG PO TABS
5.0000 mg | ORAL_TABLET | Freq: Once | ORAL | Status: DC | PRN
  Filled 2018-11-16: qty 1

## 2018-11-16 MED ORDER — ARTIFICIAL TEARS OPHTHALMIC OINT
TOPICAL_OINTMENT | OPHTHALMIC | Status: AC
  Filled 2018-11-16: qty 3.5

## 2018-11-16 MED ORDER — BUPIVACAINE HCL (PF) 0.25 % IJ SOLN
INTRAMUSCULAR | Status: DC | PRN
  Administered 2018-11-16: 14 mL

## 2018-11-16 MED ORDER — DEXAMETHASONE SODIUM PHOSPHATE 10 MG/ML IJ SOLN
INTRAMUSCULAR | Status: DC | PRN
  Administered 2018-11-16: 10 mg via INTRAVENOUS

## 2018-11-16 MED ORDER — LIDOCAINE 2% (20 MG/ML) 5 ML SYRINGE
INTRAMUSCULAR | Status: AC
  Filled 2018-11-16: qty 10

## 2018-11-16 MED ORDER — PROPOFOL 10 MG/ML IV BOLUS
INTRAVENOUS | Status: AC
  Filled 2018-11-16: qty 40

## 2018-11-16 MED ORDER — SUGAMMADEX SODIUM 200 MG/2ML IV SOLN
INTRAVENOUS | Status: AC
  Filled 2018-11-16: qty 2

## 2018-11-16 MED ORDER — LACTATED RINGERS IV SOLN
INTRAVENOUS | Status: DC
  Administered 2018-11-16 (×2): via INTRAVENOUS
  Filled 2018-11-16: qty 1000

## 2018-11-16 MED ORDER — ONDANSETRON HCL 4 MG/2ML IJ SOLN
INTRAMUSCULAR | Status: DC | PRN
  Administered 2018-11-16: 4 mg via INTRAVENOUS

## 2018-11-16 MED ORDER — MEPERIDINE HCL 25 MG/ML IJ SOLN
INTRAMUSCULAR | Status: AC
  Filled 2018-11-16: qty 1

## 2018-11-16 MED ORDER — GABAPENTIN 300 MG PO CAPS
ORAL_CAPSULE | ORAL | Status: AC
  Filled 2018-11-16: qty 1

## 2018-11-16 MED ORDER — MEPERIDINE HCL 25 MG/ML IJ SOLN
6.2500 mg | INTRAMUSCULAR | Status: DC | PRN
  Administered 2018-11-16: 12.5 mg via INTRAVENOUS
  Filled 2018-11-16: qty 1

## 2018-11-16 MED ORDER — ACETAMINOPHEN 500 MG PO TABS
ORAL_TABLET | ORAL | Status: AC
  Filled 2018-11-16: qty 2

## 2018-11-16 MED ORDER — KETOROLAC TROMETHAMINE 30 MG/ML IJ SOLN
INTRAMUSCULAR | Status: DC | PRN
  Administered 2018-11-16: 30 mg via INTRAVENOUS

## 2018-11-16 MED ORDER — ONDANSETRON HCL 4 MG/2ML IJ SOLN
INTRAMUSCULAR | Status: AC
  Filled 2018-11-16: qty 2

## 2018-11-16 MED ORDER — LIDOCAINE 2% (20 MG/ML) 5 ML SYRINGE
INTRAMUSCULAR | Status: DC | PRN
  Administered 2018-11-16: 40 mg via INTRAVENOUS

## 2018-11-16 MED ORDER — ROCURONIUM BROMIDE 10 MG/ML (PF) SYRINGE
PREFILLED_SYRINGE | INTRAVENOUS | Status: DC | PRN
  Administered 2018-11-16: 10 mg via INTRAVENOUS
  Administered 2018-11-16: 50 mg via INTRAVENOUS
  Administered 2018-11-16: 20 mg via INTRAVENOUS

## 2018-11-16 MED ORDER — IBUPROFEN 800 MG PO TABS
800.0000 mg | ORAL_TABLET | Freq: Three times a day (TID) | ORAL | Status: DC
  Administered 2018-11-16 – 2018-11-17 (×3): 800 mg via ORAL
  Filled 2018-11-16: qty 1

## 2018-11-16 MED ORDER — HYDROMORPHONE HCL 1 MG/ML IJ SOLN
0.5000 mg | INTRAMUSCULAR | Status: DC | PRN
  Administered 2018-11-16 (×3): 0.5 mg via INTRAVENOUS
  Filled 2018-11-16: qty 0.5

## 2018-11-16 MED ORDER — ACETAMINOPHEN 500 MG PO TABS
1000.0000 mg | ORAL_TABLET | ORAL | Status: AC
  Administered 2018-11-16: 1000 mg via ORAL
  Filled 2018-11-16: qty 2

## 2018-11-16 MED ORDER — GENTAMICIN SULFATE 40 MG/ML IJ SOLN
INTRAVENOUS | Status: AC
  Administered 2018-11-16: 440 mg via INTRAVENOUS
  Filled 2018-11-16: qty 11

## 2018-11-16 MED ORDER — HYDROMORPHONE HCL 1 MG/ML IJ SOLN
0.5000 mg | Freq: Once | INTRAMUSCULAR | Status: AC
  Administered 2018-11-16: 0.5 mg via INTRAVENOUS
  Filled 2018-11-16: qty 0.5

## 2018-11-16 SURGICAL SUPPLY — 59 items
ADH SKN CLS APL DERMABOND .7 (GAUZE/BANDAGES/DRESSINGS) ×1
BARRIER ADHS 3X4 INTERCEED (GAUZE/BANDAGES/DRESSINGS) IMPLANT
BRR ADH 4X3 ABS CNTRL BYND (GAUZE/BANDAGES/DRESSINGS)
CANISTER SUCT 3000ML PPV (MISCELLANEOUS) ×2 IMPLANT
CATH FOLEY 3WAY  5CC 16FR (CATHETERS) ×1
CATH FOLEY 3WAY 5CC 16FR (CATHETERS) ×1 IMPLANT
COVER BACK TABLE 60X90IN (DRAPES) ×2 IMPLANT
COVER TIP SHEARS 8 DVNC (MISCELLANEOUS) ×1 IMPLANT
COVER TIP SHEARS 8MM DA VINCI (MISCELLANEOUS) ×1
DECANTER SPIKE VIAL GLASS SM (MISCELLANEOUS) ×4 IMPLANT
DEFOGGER SCOPE WARMER CLEARIFY (MISCELLANEOUS) ×2 IMPLANT
DERMABOND ADVANCED (GAUZE/BANDAGES/DRESSINGS) ×1
DERMABOND ADVANCED .7 DNX12 (GAUZE/BANDAGES/DRESSINGS) ×1 IMPLANT
DRAPE ARM DVNC X/XI (DISPOSABLE) ×4 IMPLANT
DRAPE COLUMN DVNC XI (DISPOSABLE) ×1 IMPLANT
DRAPE DA VINCI XI ARM (DISPOSABLE) ×4
DRAPE DA VINCI XI COLUMN (DISPOSABLE) ×1
DURAPREP 26ML APPLICATOR (WOUND CARE) ×2 IMPLANT
ELECT REM PT RETURN 9FT ADLT (ELECTROSURGICAL) ×2
ELECTRODE REM PT RTRN 9FT ADLT (ELECTROSURGICAL) ×1 IMPLANT
GAUZE PETROLATUM 1 X8 (GAUZE/BANDAGES/DRESSINGS) ×2 IMPLANT
GLOVE BIO SURGEON STRL SZ 6.5 (GLOVE) ×6 IMPLANT
GLOVE BIOGEL PI IND STRL 7.0 (GLOVE) ×5 IMPLANT
GLOVE BIOGEL PI INDICATOR 7.0 (GLOVE) ×5
IRRIG SUCT STRYKERFLOW 2 WTIP (MISCELLANEOUS) ×2
IRRIGATION SUCT STRKRFLW 2 WTP (MISCELLANEOUS) ×1 IMPLANT
LEGGING LITHOTOMY PAIR STRL (DRAPES) ×2 IMPLANT
OBTURATOR OPTICAL STANDARD 8MM (TROCAR) ×1
OBTURATOR OPTICAL STND 8 DVNC (TROCAR) ×1
OBTURATOR OPTICALSTD 8 DVNC (TROCAR) ×1 IMPLANT
OCCLUDER COLPOPNEUMO (BALLOONS) ×2 IMPLANT
PACK ROBOT WH (CUSTOM PROCEDURE TRAY) ×2 IMPLANT
PACK ROBOTIC GOWN (GOWN DISPOSABLE) ×2 IMPLANT
PACK TRENDGUARD 450 HYBRID PRO (MISCELLANEOUS) IMPLANT
PAD PREP 24X48 CUFFED NSTRL (MISCELLANEOUS) ×2 IMPLANT
POUCH ENDO CATCH II 15MM (MISCELLANEOUS) IMPLANT
PROTECTOR NERVE ULNAR (MISCELLANEOUS) ×4 IMPLANT
RTRCTR WOUND ALEXIS 18CM SML (INSTRUMENTS)
SAVER CELL AAL HAEMONETICS (INSTRUMENTS) IMPLANT
SEAL CANN UNIV 5-8 DVNC XI (MISCELLANEOUS) ×3 IMPLANT
SEAL XI 5MM-8MM UNIVERSAL (MISCELLANEOUS) ×4
SET CYSTO W/LG BORE CLAMP LF (SET/KITS/TRAYS/PACK) IMPLANT
SET TRI-LUMEN FLTR TB AIRSEAL (TUBING) ×2 IMPLANT
SUT ETHIBOND 0 (SUTURE) IMPLANT
SUT VIC AB 4-0 PS2 27 (SUTURE) ×6 IMPLANT
SUT VICRYL 0 UR6 27IN ABS (SUTURE) ×2 IMPLANT
SUT VLOC 180 0 9IN  GS21 (SUTURE) ×1
SUT VLOC 180 0 9IN GS21 (SUTURE) ×1 IMPLANT
SUT VLOC 180 2-0 6IN GS21 (SUTURE) IMPLANT
TIP RUMI ORANGE 6.7MMX12CM (TIP) IMPLANT
TIP UTERINE 5.1X6CM LAV DISP (MISCELLANEOUS) IMPLANT
TIP UTERINE 6.7X10CM GRN DISP (MISCELLANEOUS) ×1 IMPLANT
TIP UTERINE 6.7X6CM WHT DISP (MISCELLANEOUS) IMPLANT
TIP UTERINE 6.7X8CM BLUE DISP (MISCELLANEOUS) IMPLANT
TOWEL OR 17X26 10 PK STRL BLUE (TOWEL DISPOSABLE) ×4 IMPLANT
TRENDGUARD 450 HYBRID PRO PACK (MISCELLANEOUS) ×2
TROCAR HASSON GELL 12X100 (TROCAR) IMPLANT
TROCAR PORT AIRSEAL 5X120 (TROCAR) ×2 IMPLANT
WATER STERILE IRR 1000ML POUR (IV SOLUTION) ×2 IMPLANT

## 2018-11-16 NOTE — Anesthesia Procedure Notes (Signed)
Procedure Name: Intubation Date/Time: 11/16/2018 8:33 AM Performed by: Wanita Chamberlain, CRNA Pre-anesthesia Checklist: Timeout performed, Patient being monitored, Suction available, Emergency Drugs available and Patient identified Patient Re-evaluated:Patient Re-evaluated prior to induction Oxygen Delivery Method: Circle system utilized Preoxygenation: Pre-oxygenation with 100% oxygen Induction Type: IV induction Ventilation: Mask ventilation without difficulty Laryngoscope Size: Mac and 4 Grade View: Grade III Tube type: Oral Tube size: 7.0 mm Number of attempts: 1 Airway Equipment and Method: Stylet Placement Confirmation: breath sounds checked- equal and bilateral,  CO2 detector,  positive ETCO2 and ETT inserted through vocal cords under direct vision Secured at: 22 cm Tube secured with: Tape Dental Injury: Teeth and Oropharynx as per pre-operative assessment  Difficulty Due To: Difficult Airway- due to reduced neck mobility Comments: Trenguard head immobilizer and all of pt's hair braids made it difficult to view v.cords. Moving braids out of the way and cricoid manipulation gave a Grade II view of v.cords

## 2018-11-16 NOTE — H&P (Addendum)
Rachel Oconnor is an 43 y.o. female. G0 Stable boyfriend  IO:[id number] Uterine Fibroids for XI Robotic TLH/Bilateral Salpingectomy  HPI:No change since last visit at the office.  Well on Progestin-only pill.  No BTB, no pelvic pain. BP today better at 140/90 on Norvasc 5 mg daily, seen by Fam MD. Feeling of pressure in the lower abdomen, but no significant pain currently. History of myomectomies assisted with the robot and history of endometrial ablation. Patient is at the point now of desiring hysterectomy.   Pertinent Gynecological History: Menses: flow is moderate Contraception: oral progesterone-only contraceptive Blood transfusions: none Sexually transmitted diseases: no past history Previous GYN Procedures: Myomectomies/Endometrial Ablation  Last mammogram: normal  Last pap: normal  OB History: G0   Menstrual History: Patient's last menstrual period was 11/02/2018.    Past Medical History:  Diagnosis Date  . Allergy   . Depression   . Fibroids 2013  . Hypertension    no meds..pt states up last year  . Refusal of blood transfusions as patient is Jehovah's Witness 2013  . 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 ABLATION     UTERUS  . davinci myomectomy  04/2011  . HERNIA REPAIR     INGUINAL  . MYOMECTOMY    . WISDOM TOOTH EXTRACTION      Family History  Problem Relation Age of Onset  . Hypertension Mother   . Cancer Father   . Hypertension Maternal Grandmother   . Breast cancer Maternal Aunt   . Diabetes Maternal Aunt   . Breast cancer Paternal Aunt   . Cancer Paternal Aunt        colon  . Diabetes Paternal Aunt     Social History:  reports that she has never smoked. She has never used smokeless tobacco. She reports current alcohol use. She reports that she does not use drugs.  Allergies:  Allergies  Allergen Reactions  . Amoxicillin     Red blotches Did it  involve swelling of the face/tongue/throat, SOB, or low BP? No Did it involve sudden or severe rash/hives, skin peeling, or any reaction on the inside of your mouth or nose? No Did you need to seek medical attention at a hospital or doctor's office? No When did it last happen?20 years ago If all above answers are "NO", may proceed with cephalosporin use.   . Other     No blood products    Medications Prior to Admission  Medication Sig Dispense Refill Last Dose  . hydrochlorothiazide (HYDRODIURIL) 25 MG tablet Take 25 mg by mouth daily.   11/15/2018 at Unknown time  . Turmeric POWD Take 1 Scoop by mouth 3 (three) times a week. Mix with hot tea and apple cider vinegar    Past Month at Unknown time  . amLODipine (NORVASC) 5 MG tablet TAKE 1 TABLET BY MOUTH ONCE DAILY (Patient not taking: No sig reported) 90 tablet 3 Not Taking at Unknown time  . cyclobenzaprine (FLEXERIL) 10 MG tablet Take 1 tablet (10 mg total) by mouth at bedtime. (Patient not taking: Reported on 11/08/2018) 15 tablet 0 Not Taking at Unknown time  . diclofenac (VOLTAREN) 75 MG EC tablet Take 1 tablet (75 mg total) by mouth 2 (two) times daily. (Patient not taking: Reported on 11/08/2018) 30 tablet 0 Not Taking at Unknown time  . fluticasone (FLONASE) 50 MCG/ACT nasal spray Place 2 sprays into both nostrils daily. (  Patient taking differently: Place 2 sprays into both nostrils daily as needed for allergies. ) 16 g 2 More than a month at Unknown time  . levocetirizine (XYZAL) 5 MG tablet Take 1 tablet (5 mg total) by mouth every evening. (Patient taking differently: Take 5 mg by mouth daily as needed for allergies. ) 30 tablet 3 More than a month at Unknown time  . montelukast (SINGULAIR) 10 MG tablet Take 1 tablet (10 mg total) by mouth at bedtime. (Patient taking differently: Take 10 mg by mouth at bedtime as needed (allergies). ) 30 tablet 3 More than a month at Unknown time  . norethindrone (MICRONOR,CAMILA,ERRIN) 0.35 MG  tablet Take 1 tablet (0.35 mg total) by mouth daily. (Patient not taking: Reported on 11/08/2018) 1 Package 11 Not Taking at Unknown time    REVIEW OF SYSTEMS: A ROS was performed and pertinent positives and negatives are included in the history.  GENERAL: No fevers or chills. HEENT: No change in vision, no earache, sore throat or sinus congestion. NECK: No pain or stiffness. CARDIOVASCULAR: No chest pain or pressure. No palpitations. PULMONARY: No shortness of breath, cough or wheeze. GASTROINTESTINAL: No abdominal pain, nausea, vomiting or diarrhea, melena or bright red blood per rectum. GENITOURINARY: No urinary frequency, urgency, hesitancy or dysuria. MUSCULOSKELETAL: No joint or muscle pain, no back pain, no recent trauma. DERMATOLOGIC: No rash, no itching, no lesions. ENDOCRINE: No polyuria, polydipsia, no heat or cold intolerance. No recent change in weight. HEMATOLOGICAL: No anemia or easy bruising or bleeding. NEUROLOGIC: No headache, seizures, numbness, tingling or weakness. PSYCHIATRIC: No depression, no loss of interest in normal activity or change in sleep pattern.     Blood pressure (!) 149/98, pulse 72, temperature 97.8 F (36.6 C), temperature source Oral, resp. rate 16, height 5' 7.5" (1.715 m), weight 87.3 kg, last menstrual period 11/02/2018, SpO2 98 %.  Physical Exam:  See office notes   Results for orders placed or performed during the hospital encounter of 11/16/18 (from the past 24 hour(s))  Pregnancy, urine POC     Status: None   Collection Time: 11/16/18  7:22 AM  Result Value Ref Range   Preg Test, Ur NEGATIVE NEGATIVE   Pelvic US 08/03/2018:T/V and T/a images. Enlarged uterus measuring 8.07 x 8.07 x 7.38 cm. Many subserous and intramural fibroids: 2.7 x 2.2 cm, 1.9 x 2.7 cm, 3.6 x 3.2 cm, 1.7 x 2 cm, 2.7 x 2.3 cm, on the left 5.6 x 3.2 cm and on the right 6.9 x 5.3 cm. Right ovary with a corpus luteum cyst measuring 3.1 x 2.4 cm with color flow Doppler positive at  the periphery. Left ovary normal. Free fluid in the posterior cul-de-sac measuring 5.8 x 4.9 cm.  Hb 07/13/2018: 12.6   Assessment/Plan:42 y.o.G0  1. Menometrorrhagia Irregular heavy menses associated with many uterine fibroids. H/O Myomectomies and Endometrial Ablation. Will proceed with XI TLH/Bilateral Salpingectomy. Surgery and risks as well as preop and postop care thoroughly reviewed. Seen by Heriberto Antigua MD to manage BP, on Norvasc 5 mg daily. Low salt diet. On Progestin-only pill to avoid heavy menses prior to surgery. [place] witness, refuses blood transfusions.   2. Fibroids As above  Patient was counseled as to the risk of surgery to include the following:  1. Infection (prohylactic antibiotics will be administered)  2. DVT/Pulmonary Embolism (prophylactic pneumo compression stockings will be used)  3.Trauma to internal organs requiring additional surgical procedure to repair any injury to internal organs requiring perhaps additional hospitalization days.  4.Hemmorhage requiring transfusion of expanders, as patient refuses blood transfusions, discussed.  Patient had received literature information on the procedure scheduled and all her questions were answered and fully accepts all risk.     Marie-Lyne Mardy Lucier 11/16/2018, 7:30 AM

## 2018-11-16 NOTE — Progress Notes (Addendum)
11/16/2018 1:02 PM Dr. Dellis Filbert contacted and made aware of patient still c/o severe 10/10 pain despite 1 mg IV Dilaudid given in RCC and 100 mcg Fentanyl IV given in PACU. Medications given pre-op and intra-op reviewed with MD. Verbal telephone order Dr. Dellis Filbert ok to initiate ordered Percocet as ordered for severe pain and verbal orders received to give patient two tablets now. Orders enacted.  Pt. Updated on plan of care. Will continue to closely monitor patient.  Kynzi Levay, Arville Lime

## 2018-11-16 NOTE — Transfer of Care (Signed)
Immediate Anesthesia Transfer of Care Note  Patient: Rachel Oconnor  Procedure(s) Performed: XI ROBOTIC ASSISTED TOTAL LAPAROSCOPIC HYSTERECTOMY AND SALPINGECTOMY (Bilateral )  Patient Location: PACU  Anesthesia Type:General  Level of Consciousness: awake, alert , oriented and patient cooperative  Airway & Oxygen Therapy: Patient Spontanous Breathing and Patient connected to nasal cannula oxygen  Post-op Assessment: Report given to RN and Post -op Vital signs reviewed and stable  Post vital signs: Reviewed and stable  Last Vitals:  Vitals Value Taken Time  BP 144/97 11/16/2018 11:16 AM  Temp 36.4 C 11/16/2018 11:16 AM  Pulse 82 11/16/2018 11:19 AM  Resp 15 11/16/2018 11:19 AM  SpO2 100 % 11/16/2018 11:19 AM  Vitals shown include unvalidated device data.  Last Pain:  Vitals:   11/16/18 1116  TempSrc: Oral  PainSc:       Patients Stated Pain Goal: 5 (84/21/03 1281)  Complications: No apparent anesthesia complications

## 2018-11-16 NOTE — Op Note (Signed)
Operative Note  11/16/2018  11:11 AM  PATIENT:  Rachel Oconnor  43 y.o. female  PRE-OPERATIVE DIAGNOSIS:  Fibroids, menometrorrhagia  POST-OPERATIVE DIAGNOSIS:  Fibroids, menometrorrhagia  PROCEDURE:  Procedure(s): XI ROBOTIC ASSISTED LYSIS OF ADHESIONS, TOTAL LAPAROSCOPIC HYSTERECTOMY AND BILATERAL SALPINGECTOMY  SURGEON:  Surgeon(s): Princess Bruins, MD  ANESTHESIA:   general  FINDINGS: Intestinal adhesions to the posterior uterus and left adnexa, uterine fibroids, normal ovaries and tubes.  Mild pelvic endometriosis.  DESCRIPTION OF OPERATION: Under general anesthesia with endotracheal intubation, the patient is in lithotomy position.  She is prepped with DuraPrep on the abdomen and with Betadine on the suprapubic, vulvar and vaginal areas.  She is draped as usual.  Timeout is done.  The vaginal exam reveals a nodular uterus increase in size to about 10 cm mobile.  No adnexal mass.  The Foley catheter is put in place in the bladder.  #10 Rumi with the medium Koh ring are put in place easily.  The other instruments are removed from the vagina.  We go to the abdomen.      The supraumbilical area is infiltrated with Marcaine one quarter plain.  A 1.5 cm incision is done with a scalpel at that level.  The aponeurosis is grasped with Coker's and opened with Mayo scissors.  The parietal peritoneum is opened bluntly with the finger.  A pursestring stitch of Vicryl 0 was done on the aponeurosis.  The Sheryle Hail is inserted at that level under direct vision and a pneumoperitoneum is created with CO2.  The skin is marked and infiltrated with Marcaine one quarter plain at each port sites.  Small incisions are made with a scalpel.  3 robotic ports are inserted under direct vision with 2 on the right and one on the distal left.  The assistant port a 5 mm port is inserted at the medial left.  The patient is positioned in deep Trendelenburg.  The robot is docked on the right side.  Targeting was done.   Robotic instruments were inserted under direct vision with the fenestrated clamp in the fourth arm, the scissors in the third arm, the camera in the second arm, and the PK bipolar in the first arm.  We go to the console.      Inspection of the abdominal pelvic cavities reveal a normal appendix. The uterus presents many fibroids with 2 large ones at the posterior fundus.  Adhesions between the posterior uterus and the bowels are present as well as adhesions between the bowels and the left adnexa.  Otherwise the tubes and ovaries are normal.  A small lesion of endometriosis was later seen at the left posterior cutis sac.  Pictures were taken of all structures.  We started with delicate and precise lysis of adhesions between the uterus and the bowels and then freeing the left adnexa.  The ureters were seen in normal anatomy position with good peristalsis bilaterally.  We cauterized and section the left mesosalpinx.  We cauterized and section the left utero-ovarian ligament.  We cauterized and sectioned the left round ligament.  We opened the broad ligament on the left close to the uterus.  We started opening the visceral peritoneum anteriorly to descend the bladder.  We proceeded exactly the same way on the right side.  We then completely opened the visceral peritoneum anteriorly and descended the bladder well past the [facility] ring.  We cauterized and section the right uterine artery after cauterizing the backflow..  We cauterized and section the left uterine  artery after cauterizing the backflow.  We inflated the vaginal occluder.  Opening of the upper vagina circumferentially with the tips of the scissors.  The uterus with cervix and bilateral tubes are completely detached and successfully passed vaginally.  The specimen is sent to pathology.  Hemostasis at the vaginal vault with the bipolar.  The robotic instruments are changed to the cutting needle driver in the right hand and the long tip in the left hand.  The  fenestrated clamp is kept in the fourth arm.  We use a V lock 0 at 9 inches to close the vaginal vault.  We start at the right angle and go all the way to the left angle and then back on our steps.  The vaginal vault is well closed and hemostasis is good.  The pelvic cavity is suction.  Pictures are taken.  Robotic instruments are removed.  The robot is undocked.  All ports are removed under direct vision.  The CO2 is evacuated.  The supraumbilical incision is closed by attaching the pursestring stitch at the umbilicus.  We then closed every incision with separate stitches of Vicryl 4-0.  Dermabond is added on all incisions.  The occluder was removed from the vagina.  The patient is brought to recovery room in good and stable status.  ESTIMATED BLOOD LOSS: 50 mL   Intake/Output Summary (Last 24 hours) at 11/16/2018 1111 Last data filed at 11/16/2018 1043 Gross per 24 hour  Intake 500 ml  Output 150 ml  Net 350 ml     BLOOD ADMINISTERED:none   LOCAL MEDICATIONS USED:  MARCAINE     SPECIMEN:  Source of Specimen:  Uterus with cervix and both tubes  DISPOSITION OF SPECIMEN:  PATHOLOGY  COUNTS:  YES  PLAN OF CARE: Transfer to PACU  Marie-Lyne LavoieMD11:11 AM

## 2018-11-16 NOTE — Anesthesia Postprocedure Evaluation (Signed)
Anesthesia Post Note  Patient: Nevayah A Dolin  Procedure(s) Performed: XI ROBOTIC ASSISTED TOTAL LAPAROSCOPIC HYSTERECTOMY AND SALPINGECTOMY (Bilateral )     Patient location during evaluation: PACU Anesthesia Type: General Level of consciousness: awake and alert and oriented Pain management: pain level controlled Vital Signs Assessment: post-procedure vital signs reviewed and stable Respiratory status: spontaneous breathing, nonlabored ventilation and respiratory function stable Cardiovascular status: blood pressure returned to baseline and stable Postop Assessment: no apparent nausea or vomiting Anesthetic complications: no    Last Vitals:  Vitals:   11/16/18 1207 11/16/18 1220  BP:  (!) 140/96  Pulse: 67 70  Resp: 13 17  Temp:  (!) 36.4 C  SpO2: 100% 100%    Last Pain:  Vitals:   11/16/18 1220  TempSrc:   PainSc: 10-Worst pain ever                 Bralen Wiltgen A.

## 2018-11-17 ENCOUNTER — Encounter (HOSPITAL_BASED_OUTPATIENT_CLINIC_OR_DEPARTMENT_OTHER): Payer: Self-pay | Admitting: Obstetrics & Gynecology

## 2018-11-17 DIAGNOSIS — D259 Leiomyoma of uterus, unspecified: Secondary | ICD-10-CM | POA: Diagnosis not present

## 2018-11-17 LAB — CBC
HCT: 34.5 % — ABNORMAL LOW (ref 36.0–46.0)
Hemoglobin: 10.9 g/dL — ABNORMAL LOW (ref 12.0–15.0)
MCH: 30.7 pg (ref 26.0–34.0)
MCHC: 31.6 g/dL (ref 30.0–36.0)
MCV: 97.2 fL (ref 80.0–100.0)
Platelets: 344 10*3/uL (ref 150–400)
RBC: 3.55 MIL/uL — AB (ref 3.87–5.11)
RDW: 14.6 % (ref 11.5–15.5)
WBC: 15 10*3/uL — ABNORMAL HIGH (ref 4.0–10.5)
nRBC: 0 % (ref 0.0–0.2)

## 2018-11-17 MED ORDER — OXYCODONE-ACETAMINOPHEN 5-325 MG PO TABS
ORAL_TABLET | ORAL | Status: AC
  Filled 2018-11-17: qty 2

## 2018-11-17 MED ORDER — OXYCODONE-ACETAMINOPHEN 7.5-325 MG PO TABS: 1.0000 | ORAL_TABLET | Freq: Four times a day (QID) | ORAL | 0 refills | Status: DC | PRN

## 2018-11-17 MED ORDER — IBUPROFEN 200 MG PO TABS
ORAL_TABLET | ORAL | Status: AC
  Filled 2018-11-17: qty 4

## 2018-11-17 NOTE — Discharge Instructions (Signed)
Total Laparoscopic Hysterectomy, Care After °This sheet gives you information about how to care for yourself after your procedure. Your health care provider may also give you more specific instructions. If you have problems or questions, contact your health care provider. °What can I expect after the procedure? °After the procedure, it is common to have: °· Pain and bruising around your incisions. °· A sore throat, if a breathing tube was used during surgery. °· Fatigue. °· Poor appetite. °· Less interest in sex. °If your ovaries were also removed, it is also common to have symptoms of menopause such as hot flashes, night sweats, and lack of sleep (insomnia). °Follow these instructions at home: °Bathing °· Do not take baths, swim, or use a hot tub until your health care provider approves. You may need to only take showers for 2-3 weeks. °· Keep your bandage (dressing) dry until your health care provider says it can be removed. °Incision care ° °· Follow instructions from your health care provider about how to take care of your incisions. Make sure you: °? Wash your hands with soap and water before you change your dressing. If soap and water are not available, use hand sanitizer. °? Change your dressing as told by your health care provider. °? Leave stitches (sutures), skin glue, or adhesive strips in place. These skin closures may need to stay in place for 2 weeks or longer. If adhesive strip edges start to loosen and curl up, you may trim the loose edges. Do not remove adhesive strips completely unless your health care provider tells you to do that. °· Check your incision area every day for signs of infection. Check for: °? Redness, swelling, or pain. °? Fluid or blood. °? Warmth. °? Pus or a bad smell. °Activity °· Get plenty of rest and sleep. °· Do not lift anything that is heavier than 10 lbs (4.5 kg) for one month after surgery, or as long as told by your health care provider. °· Do not drive or use heavy  machinery while taking prescription pain medicine. °· Do not drive for 24 hours if you were given a medicine to help you relax (sedative). °· Return to your normal activities as told by your health care provider. Ask your health care provider what activities are safe for you. °Lifestyle ° °· Do not use any products that contain nicotine or tobacco, such as cigarettes and e-cigarettes. These can delay healing. If you need help quitting, ask your health care provider. °· Do not drink alcohol until your health care provider approves. °General instructions °· Do not douche, use tampons, or have sex for at least 6 weeks, or as told by your health care provider. °· Take over-the-counter and prescription medicines only as told by your health care provider. °· To monitor yourself for a fever, take your temperature at least once a day during recovery. °· If you struggle with physical or emotional changes after your procedure, speak with your health care provider or a therapist. °· To prevent or treat constipation while you are taking prescription pain medicine, your health care provider may recommend that you: °? Drink enough fluid to keep your urine clear or pale yellow. °? Take over-the-counter or prescription medicines. °? Eat foods that are high in fiber, such as fresh fruits and vegetables, whole grains, and beans. °? Limit foods that are high in fat and processed sugars, such as fried and sweet foods. °· Keep all follow-up visits as told by your health care provider.   This is important. °Contact a health care provider if: °· You have chills or a fever. °· You have redness, swelling, or pain around an incision. °· You have fluid or blood coming from an incision. °· Your incision feels warm to the touch. °· You have pus or a bad smell coming from an incision. °· An incision breaks open. °· You feel dizzy or light-headed. °· You have pain or bleeding when you urinate. °· You have diarrhea, nausea, or vomiting that does not  go away. °· You have abnormal vaginal discharge. °· You have a rash. °· You have pain that does not get better with medicine. °Get help right away if: °· You have a fever and your symptoms suddenly get worse. °· You have severe abdominal pain. °· You have chest pain. °· You have shortness of breath. °· You faint. °· You have pain, swelling, or redness on your leg. °· You have heavy vaginal bleeding with blood clots. °Summary °· After the procedure it is common to have abdominal pain. Your provider will give you medication for this. °· Do not take baths, swim, or use a hot tub until your health care provider approves. °· Do not lift anything that is heavier than 10 lbs (4.5 kg) for one month after surgery, or as long as told by your health care provider. °· Notify your provider if you have any signs or symptoms of infection after the procedure. °This information is not intended to replace advice given to you by your health care provider. Make sure you discuss any questions you have with your health care provider. °Document Released: 06/29/2013 Document Revised: 11/19/2016 Document Reviewed: 11/19/2016 °Elsevier Interactive Patient Education © 2019 [employer] ° °

## 2018-11-17 NOTE — Discharge Summary (Signed)
Physician Discharge Summary  Patient ID: Rachel Oconnor MRN: [id number] DOB/AGE: 1976/02/12 43 y.o.  Admit date: 11/16/2018 Discharge date: 11/17/2018  Admission Diagnoses: fibroids, menometrorrhagia   Discharge Diagnoses: fibroids, menometrorrhagia Active Problems:   Post-operative state   Postoperative state   Discharged Condition: good  Consults:None  Significant Diagnostic Studies: labs: Postop Hb 10.9  Treatments:surgery: Robotic Lysis of Adhesions, Total Laparoscopic Hysterectomy with Bilateral Salpingectomy  Vitals:   11/17/18 0200 11/17/18 0557  BP: 120/71 111/68  Pulse: 69 62  Resp: 16 17  Temp: 98.8 F (37.1 C) 98.3 F (36.8 C)  SpO2: 98% 98%     No intake/output data recorded.   Hospital Course: Good  Discharge Exam: Normal postop  Disposition: Discharge disposition: 01-Home or Self Care       Discharge Instructions    Discharge patient   Complete by:  As directed    Discharge disposition:  01-Home or Self Care   Discharge patient date:  11/17/2018       Allergies as of 11/17/2018      Reactions   Amoxicillin    Red blotches Did it involve swelling of the face/tongue/throat, SOB, or low BP? No Did it involve sudden or severe rash/hives, skin peeling, or any reaction on the inside of your mouth or nose? No Did you need to seek medical attention at a hospital or doctor's office? No When did it last happen?20 years ago If all above answers are "NO", may proceed with cephalosporin use.   Other    No blood products      Medication List    STOP taking these medications   cyclobenzaprine 10 MG tablet Commonly known as:  FLEXERIL   diclofenac 75 MG EC tablet Commonly known as:  VOLTAREN   norethindrone 0.35 MG tablet Commonly known as:  MICRONOR,CAMILA,ERRIN     TAKE these medications   amLODipine 5 MG tablet Commonly known as:  NORVASC TAKE 1 TABLET BY MOUTH ONCE DAILY   fluticasone 50 MCG/ACT nasal spray Commonly known as:   FLONASE Place 2 sprays into both nostrils daily. What changed:    when to take this  reasons to take this   hydrochlorothiazide 25 MG tablet Commonly known as:  HYDRODIURIL Take 25 mg by mouth daily.   levocetirizine 5 MG tablet Commonly known as:  XYZAL Take 1 tablet (5 mg total) by mouth every evening. What changed:    when to take this  reasons to take this   montelukast 10 MG tablet Commonly known as:  SINGULAIR Take 1 tablet (10 mg total) by mouth at bedtime. What changed:    when to take this  reasons to take this   oxyCODONE-acetaminophen 7.5-325 MG tablet Commonly known as:  PERCOCET Take 1 tablet by mouth every 6 (six) hours as needed for severe pain.   Turmeric Powd Take 1 Scoop by mouth 3 (three) times a week. Mix with hot tea and apple cider vinegar           Signed: Princess Bruins 11/17/2018, 8:08 AM

## 2018-11-17 NOTE — Progress Notes (Signed)
POD#1 Robotic Lysis of Adhesions, TLH/Bilateral Salpingectomy  Subjective: Patient reports tolerating PO, + flatus and no problems voiding.    Objective: I have reviewed patient's vital signs.  vital signs, intake and output, medications and labs.  Vitals:   11/17/18 0557 11/17/18 0800  BP: 111/68 117/79  Pulse: 62 63  Resp: 17   Temp: 98.3 F (36.8 C) 98.3 F (36.8 C)  SpO2: 98% 99%   I/O last 3 completed shifts: In: 3290 [P.O.:1490; I.V.:1750; IV Piggyback:50] Out: 0233 [Urine:3100; Blood:50] No intake/output data recorded.  Results for orders placed or performed during the hospital encounter of 11/16/18 (from the past 24 hour(s))  CBC     Status: Abnormal   Collection Time: 11/17/18  5:51 AM  Result Value Ref Range   WBC 15.0 (H) 4.0 - 10.5 K/uL   RBC 3.55 (L) 3.87 - 5.11 MIL/uL   Hemoglobin 10.9 (L) 12.0 - 15.0 g/dL   HCT 34.5 (L) 36.0 - 46.0 %   MCV 97.2 80.0 - 100.0 fL   MCH 30.7 26.0 - 34.0 pg   MCHC 31.6 30.0 - 36.0 g/dL   RDW 14.6 11.5 - 15.5 %   Platelets 344 150 - 400 K/uL   nRBC 0.0 0.0 - 0.2 %    EXAM General: alert, cooperative and appears stated age Resp: clear to auscultation bilaterally Cardio: regular rate and rhythm GI: normal findings: bowel sounds normal and soft, non-tender and incision: clean, dry and intact Extremities: no edema, redness or tenderness in the calves or thighs Vaginal Bleeding: none  Assessment: s/p Procedure(s): XI ROBOTIC ASSISTED TOTAL LAPAROSCOPIC HYSTERECTOMY AND SALPINGECTOMY: stable, progressing well and tolerating diet  Plan: Advance diet Encourage ambulation Discharge home  LOS: 0 days    Princess Bruins, MD 11/17/2018 8:14 AM

## 2018-11-30 ENCOUNTER — Ambulatory Visit: Payer: 59 | Admitting: Obstetrics & Gynecology

## 2018-12-01 ENCOUNTER — Telehealth: Payer: Self-pay | Admitting: *Deleted

## 2018-12-01 NOTE — Telephone Encounter (Signed)
Patient called post Robotic TLH/Bilateral Salpingectomy on 11/16/18, called c/o small itching bumps on left side incision, also noticed itching and pealing of skin at the bottom foot. She stopped her Percocet and only using ibuprofen as needed. Does admit to starting a new blood pressure medication right before surgery,asked if okay to use benadryl as needed. I told her I think this will be fine, do you agree any other recommendations?

## 2018-12-03 NOTE — Telephone Encounter (Signed)
Make sure she cleans the incision.  Vaseline to remove any remaining glue.  Ok with Benadryl.

## 2018-12-03 NOTE — Telephone Encounter (Signed)
Patient informed. 

## 2018-12-09 ENCOUNTER — Other Ambulatory Visit: Payer: Self-pay

## 2018-12-10 ENCOUNTER — Encounter: Payer: Self-pay | Admitting: Obstetrics & Gynecology

## 2018-12-10 ENCOUNTER — Ambulatory Visit (INDEPENDENT_AMBULATORY_CARE_PROVIDER_SITE_OTHER): Payer: 59 | Admitting: Obstetrics & Gynecology

## 2018-12-10 VITALS — BP 144/90

## 2018-12-10 DIAGNOSIS — Z09 Encounter for follow-up examination after completed treatment for conditions other than malignant neoplasm: Secondary | ICD-10-CM

## 2018-12-10 NOTE — Progress Notes (Signed)
    Rachel Oconnor [birth year] [id number]        42 y.o.  G0  RP: Postop Robotic TLH/Bilateral Salpingectomy 11/16/2018  HPI: Doing better and better.  Rash on left abdomen and under the breasts is improving.  Mild clear discharge at the umbilical incision.  No abdomino-pelvic pain.  Minimal spotting occasionally when wiping.  Urine/BMs normal.  No fever.   OB History  Gravida Para Term Preterm AB Living  0 0 0 0 0 0  SAB TAB Ectopic Multiple Live Births  0 0 0 0 0    Past medical history,surgical history, problem list, medications, allergies, family history and social history were all reviewed and documented in the EPIC chart.   Directed ROS with pertinent positives and negatives documented in the history of present illness/assessment and plan.  Exam:  Vitals:   12/10/18 1416  BP: (!) 144/90   General appearance:  Normal  Abdomen: 4 lateral incisions well closed, no erythema, NT.  Umbilical incision: No erythema, mild clear discharge when pressing, NT.  Rash on left abdomen is dried.  Gynecologic exam: Vulva normal.  Speculum:  Vaginal vault well closed.  No blood.  Normal vaginal secretions.   Assessment/Plan:  43 y.o. G0  1. Status post gynecological surgery, follow-up exam Good postop evolution.  No complication, except small superficial seroma finishing to drain at the umbilical incision.  No evidence of infection.  Vaginal vault healing well.  Rash resolving.  Patho benign.  Precautions reviewed with patient.  F/U 4 weeks to reassess the vaginal vault prior to resuming sexual activity.   Princess Bruins MD, 2:23 PM 12/10/2018

## 2018-12-10 NOTE — Patient Instructions (Signed)
1. Status post gynecological surgery, follow-up exam Good postop evolution.  No complication, except small superficial seroma finishing to drain at the umbilical incision.  No evidence of infection.  Vaginal vault healing well.  Rash resolving.  Patho benign.  Precautions reviewed with patient.  F/U 4 weeks to reassess the vaginal vault prior to resuming sexual activity.  Rachel Oconnor, it was a pleasure seeing you today!

## 2019-01-07 ENCOUNTER — Other Ambulatory Visit: Payer: Self-pay

## 2019-01-10 ENCOUNTER — Encounter: Payer: Self-pay | Admitting: Obstetrics & Gynecology

## 2019-01-10 ENCOUNTER — Ambulatory Visit: Payer: 59 | Admitting: Obstetrics & Gynecology

## 2019-01-10 ENCOUNTER — Ambulatory Visit (INDEPENDENT_AMBULATORY_CARE_PROVIDER_SITE_OTHER): Payer: 59 | Admitting: Obstetrics & Gynecology

## 2019-01-10 ENCOUNTER — Other Ambulatory Visit: Payer: Self-pay

## 2019-01-10 VITALS — BP 128/76

## 2019-01-10 DIAGNOSIS — Z09 Encounter for follow-up examination after completed treatment for conditions other than malignant neoplasm: Secondary | ICD-10-CM

## 2019-01-10 NOTE — Progress Notes (Signed)
    Rachel Oconnor [birth year] [id number]        43 y.o.  G0P0000 Stable partner  RP: Postop Robotic TLH/Bilateral Salpingectomy 11/16/2018  HPI: Doing very well.  Resolved itching/rash on left abdomen.  No abdo-pelvic pain.  No vaginal bleeding.  Urine/BMs normal.  No fever.   OB History  Gravida Para Term Preterm AB Living  0 0 0 0 0 0  SAB TAB Ectopic Multiple Live Births  0 0 0 0 0    Past medical history,surgical history, problem list, medications, allergies, family history and social history were all reviewed and documented in the EPIC chart.   Directed ROS with pertinent positives and negatives documented in the history of present illness/assessment and plan.  Exam:  Vitals:   01/10/19 1000  BP: 128/76   General appearance:  Normal  Abdomen:  Soft, NT, not distended.  Incisions well healed.  Gynecologic exam: Vulva normal.  Bimanual exam: Vaginal vault intact, well closed with no induration, nontender.  No pelvic mass or cyst felt, nontender.  Normal vaginal secretions and no bleeding.   Assessment/Plan:  43 y.o. G0  1. Status post gynecological surgery, follow-up exam Excellent postop recovery.  Normal gynecologic exam today.  Can resume all physical and sexual activities.  We will follow-up at annual gynecologic exam.  Princess Bruins MD, 10:45 AM 01/10/2019

## 2019-01-10 NOTE — Patient Instructions (Signed)
1. Status post gynecological surgery, follow-up exam Excellent postop recovery.  Normal gynecologic exam today.  Can resume all physical and sexual activities.  We will follow-up at annual gynecologic exam.  Rachel Oconnor, it was a pleasure seeing you today!

## 2019-02-07 ENCOUNTER — Other Ambulatory Visit: Payer: Self-pay | Admitting: Medical

## 2019-03-30 ENCOUNTER — Encounter: Payer: 59 | Admitting: Obstetrics & Gynecology

## 2019-04-07 ENCOUNTER — Other Ambulatory Visit: Payer: Self-pay

## 2019-04-08 ENCOUNTER — Ambulatory Visit: Payer: 59 | Admitting: Obstetrics & Gynecology

## 2019-04-08 ENCOUNTER — Encounter: Payer: Self-pay | Admitting: Obstetrics & Gynecology

## 2019-04-08 VITALS — BP 144/92 | Ht 67.0 in | Wt 190.0 lb

## 2019-04-08 DIAGNOSIS — E663 Overweight: Secondary | ICD-10-CM | POA: Diagnosis not present

## 2019-04-08 DIAGNOSIS — Z01419 Encounter for gynecological examination (general) (routine) without abnormal findings: Secondary | ICD-10-CM | POA: Diagnosis not present

## 2019-04-08 DIAGNOSIS — Z90710 Acquired absence of both cervix and uterus: Secondary | ICD-10-CM

## 2019-04-08 NOTE — Progress Notes (Signed)
    Rachel Oconnor 1975/12/03 [id number]   History:    43 y.o. G0 Stable partner  RP:  Established patient presenting for annual gyn exam   HPI: S/P Robotic TLH/Bilateral Salpingectomy 11/16/2018.  No abdo-pelvic pain.  No pain with IC.  Urine/BMs normal.  Breast normal.  Body mass index 29.76.  Health labs with family physician.  Past medical history,surgical history, family history and social history were all reviewed and documented in the EPIC chart.  Gynecologic History Patient's last menstrual period was 11/02/2018. Contraception: status post hysterectomy Last Pap: Pathology Cervix from total hysterectomy 11/16/2018:  Normal Last mammogram: 06/2018. Results were: Negative Bone Density: Never Colonoscopy: Never  Obstetric History OB History  Gravida Para Term Preterm AB Living  0 0 0 0 0 0  SAB TAB Ectopic Multiple Live Births  0 0 0 0 0     ROS: A ROS was performed and pertinent positives and negatives are included in the history.  GENERAL: No fevers or chills. HEENT: No change in vision, no earache, sore throat or sinus congestion. NECK: No pain or stiffness. CARDIOVASCULAR: No chest pain or pressure. No palpitations. PULMONARY: No shortness of breath, cough or wheeze. GASTROINTESTINAL: No abdominal pain, nausea, vomiting or diarrhea, melena or bright red blood per rectum. GENITOURINARY: No urinary frequency, urgency, hesitancy or dysuria. MUSCULOSKELETAL: No joint or muscle pain, no back pain, no recent trauma. DERMATOLOGIC: No rash, no itching, no lesions. ENDOCRINE: No polyuria, polydipsia, no heat or cold intolerance. No recent change in weight. HEMATOLOGICAL: No anemia or easy bruising or bleeding. NEUROLOGIC: No headache, seizures, numbness, tingling or weakness. PSYCHIATRIC: No depression, no loss of interest in normal activity or change in sleep pattern.     Exam:   BP (!) 144/92   Ht 5\' 7"  (1.702 m)   Wt 190 lb (86.2 kg)   LMP 11/02/2018   BMI 29.76 kg/m   Body  mass index is 29.76 kg/m.  General appearance : Well developed well nourished female. No acute distress HEENT: Eyes: no retinal hemorrhage or exudates,  Neck supple, trachea midline, no carotid bruits, no thyroidmegaly Lungs: Clear to auscultation, no rhonchi or wheezes, or rib retractions  Heart: Regular rate and rhythm, no murmurs or gallops Breast:Examined in sitting and supine position were symmetrical in appearance, no palpable masses or tenderness,  no skin retraction, no nipple inversion, no nipple discharge, no skin discoloration, no axillary or supraclavicular lymphadenopathy Abdomen: no palpable masses or tenderness, no rebound or guarding Extremities: no edema or skin discoloration or tenderness  Pelvic: Vulva: Normal             Vagina: No gross lesions or discharge  Cervix/Uterus absent  Adnexa  Without masses or tenderness  Anus: Normal   Assessment/Plan:  43 y.o. female for annual exam   1. Well female exam with routine gynecological exam Gynecologic exam status post total hysterectomy.  Pathology on cervix was completely benign February 2020, no indication to do a Pap test this year.  Breast exam normal.  Screening mammogram October 2019 was negative.  Health labs with family physician.  2. S/P total hysterectomy  3. Overweight (BMI 25.0-29.9) Encouraged to continue losing weight.  Low calorie/carb diet.  Aerobic physical activities 5 times a week and weightlifting every 2 days.  Princess Bruins MD, 3:50 PM 04/08/2019

## 2019-04-10 ENCOUNTER — Encounter: Payer: Self-pay | Admitting: Obstetrics & Gynecology

## 2019-04-10 NOTE — Patient Instructions (Signed)
1. Well female exam with routine gynecological exam Gynecologic exam status post total hysterectomy.  Pathology on cervix was completely benign February 2020, no indication to do a Pap test this year.  Breast exam normal.  Screening mammogram October 2019 was negative.  Health labs with family physician.  2. S/P total hysterectomy  3. Overweight (BMI 25.0-29.9) Encouraged to continue losing weight.  Low calorie/carb diet.  Aerobic physical activities 5 times a week and weightlifting every 2 days.  Rachel Oconnor, it was a pleasure seeing you today!

## 2019-06-29 ENCOUNTER — Other Ambulatory Visit: Payer: Self-pay

## 2019-06-29 ENCOUNTER — Ambulatory Visit (INDEPENDENT_AMBULATORY_CARE_PROVIDER_SITE_OTHER): Payer: 59 | Admitting: Family Medicine

## 2019-06-29 ENCOUNTER — Encounter: Payer: Self-pay | Admitting: Family Medicine

## 2019-06-29 ENCOUNTER — Other Ambulatory Visit: Payer: Self-pay | Admitting: Family Medicine

## 2019-06-29 DIAGNOSIS — M545 Low back pain, unspecified: Secondary | ICD-10-CM

## 2019-06-29 MED ORDER — MELOXICAM 15 MG PO TABS: 15.0000 mg | ORAL_TABLET | Freq: Every day | ORAL | 0 refills | Status: DC

## 2019-06-29 MED ORDER — CYCLOBENZAPRINE HCL 10 MG PO TABS: 5.0000 mg | ORAL_TABLET | Freq: Three times a day (TID) | ORAL | 0 refills | Status: DC | PRN

## 2019-06-29 NOTE — Progress Notes (Signed)
Musculoskeletal Exam  Patient: Rachel Oconnor DOB: 18-Feb-1976  DOS: 06/29/2019  SUBJECTIVE:  Chief Complaint:   Low back pain  Rachel Oconnor is a 43 y.o.  female for evaluation and treatment of her back pain. Due to COVID-19 pandemic, we are interacting via web portal for an electronic face-to-face visit. I verified patient's ID using 2 identifiers. Patient agreed to proceed with visit via this method. Patient is at home, I am at office. Patient and I are present for visit.   Onset:  3 days ago. No inj or change in activity.  Location: lower Character:  shooting  Laying down makes it better. Bending over makes it worse. Progression of issue:  is unchanged Associated symptoms: pain with walking No bruising, redness, swelling.  Denies bowel/bladder incontinence or weakness Treatment: to date has been OTC NSAIDS, acetaminophen and soaks.   Neurovascular symptoms: no  ROS: Musculoskeletal/Extremities: +back pain Neurologic: no numbness, tingling no weakness   Past Medical History:  Diagnosis Date  . Allergy   . Depression   . Fibroids 2013  . Hypertension    no meds..pt states up last year  . Refusal of blood transfusions as patient is Jehovah's Witness 2013  . 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 Stretches/exercises, heat, ice, Tylenol, Warnings about Flexeril given.  F/u prn. The patient voiced understanding and agreement to the plan.   [place], DO 06/29/19  4:48 PM

## 2019-06-30 ENCOUNTER — Telehealth: Payer: Self-pay | Admitting: *Deleted

## 2019-06-30 ENCOUNTER — Encounter: Payer: Self-pay | Admitting: Family Medicine

## 2019-06-30 NOTE — Telephone Encounter (Signed)
Did note anything in chart about note.  mychart message sent to patient to let us know what she needs.  Copied from [place] [phone]. Topic: General - Other >> Jun 29, 2019  5:53 PM Mcneil, Ja-Kwan wrote: Reason for CRM: Pt called to request that the doctor's note be uploaded to her [facility] account.

## 2019-08-12 ENCOUNTER — Other Ambulatory Visit (HOSPITAL_BASED_OUTPATIENT_CLINIC_OR_DEPARTMENT_OTHER): Payer: Self-pay | Admitting: Family Medicine

## 2019-08-12 DIAGNOSIS — Z1231 Encounter for screening mammogram for malignant neoplasm of breast: Secondary | ICD-10-CM

## 2019-08-15 ENCOUNTER — Encounter (HOSPITAL_BASED_OUTPATIENT_CLINIC_OR_DEPARTMENT_OTHER): Payer: Self-pay

## 2019-08-15 ENCOUNTER — Ambulatory Visit (HOSPITAL_BASED_OUTPATIENT_CLINIC_OR_DEPARTMENT_OTHER)
Admission: RE | Admit: 2019-08-15 | Discharge: 2019-08-15 | Disposition: A | Discharge status: Home / Self Care | Payer: 59 | Source: Ambulatory Visit | Attending: Family Medicine | Admitting: Family Medicine

## 2019-08-15 DIAGNOSIS — Z1231 Encounter for screening mammogram for malignant neoplasm of breast: Secondary | ICD-10-CM | POA: Diagnosis present

## 2019-08-16 ENCOUNTER — Other Ambulatory Visit: Payer: Self-pay | Admitting: Family Medicine

## 2019-08-16 DIAGNOSIS — R928 Other abnormal and inconclusive findings on diagnostic imaging of breast: Secondary | ICD-10-CM

## 2019-08-23 ENCOUNTER — Encounter (HOSPITAL_BASED_OUTPATIENT_CLINIC_OR_DEPARTMENT_OTHER): Payer: Self-pay | Admitting: *Deleted

## 2019-08-23 ENCOUNTER — Emergency Department (HOSPITAL_BASED_OUTPATIENT_CLINIC_OR_DEPARTMENT_OTHER)
Admission: EM | Admit: 2019-08-23 | Discharge: 2019-08-23 | Disposition: A | Discharge status: Home / Self Care | Payer: 59 | Attending: Emergency Medicine | Admitting: Emergency Medicine

## 2019-08-23 ENCOUNTER — Other Ambulatory Visit: Payer: Self-pay

## 2019-08-23 DIAGNOSIS — Z79899 Other long term (current) drug therapy: Secondary | ICD-10-CM | POA: Diagnosis not present

## 2019-08-23 DIAGNOSIS — R200 Anesthesia of skin: Secondary | ICD-10-CM | POA: Diagnosis present

## 2019-08-23 DIAGNOSIS — I10 Essential (primary) hypertension: Secondary | ICD-10-CM | POA: Insufficient documentation

## 2019-08-23 DIAGNOSIS — R202 Paresthesia of skin: Secondary | ICD-10-CM | POA: Diagnosis not present

## 2019-08-23 LAB — CBC WITH DIFFERENTIAL/PLATELET
Abs Immature Granulocytes: 0.01 10*3/uL (ref 0.00–0.07)
Basophils Absolute: 0 10*3/uL (ref 0.0–0.1)
Basophils Relative: 1 %
Eosinophils Absolute: 0.2 10*3/uL (ref 0.0–0.5)
Eosinophils Relative: 3 %
HCT: 42.1 % (ref 36.0–46.0)
Hemoglobin: 13.4 g/dL (ref 12.0–15.0)
Immature Granulocytes: 0 %
Lymphocytes Relative: 36 %
Lymphs Abs: 2 10*3/uL (ref 0.7–4.0)
MCH: 29.7 pg (ref 26.0–34.0)
MCHC: 31.8 g/dL (ref 30.0–36.0)
MCV: 93.3 fL (ref 80.0–100.0)
Monocytes Absolute: 0.5 10*3/uL (ref 0.1–1.0)
Monocytes Relative: 8 %
Neutro Abs: 2.8 10*3/uL (ref 1.7–7.7)
Neutrophils Relative %: 52 %
Platelets: 419 10*3/uL — ABNORMAL HIGH (ref 150–400)
RBC: 4.51 MIL/uL (ref 3.87–5.11)
RDW: 14.7 % (ref 11.5–15.5)
WBC: 5.5 10*3/uL (ref 4.0–10.5)
nRBC: 0 % (ref 0.0–0.2)

## 2019-08-23 LAB — BASIC METABOLIC PANEL
Anion gap: 8 (ref 5–15)
BUN: 15 mg/dL (ref 6–20)
CO2: 25 mmol/L (ref 22–32)
Calcium: 9.6 mg/dL (ref 8.9–10.3)
Chloride: 105 mmol/L (ref 98–111)
Creatinine, Ser: 0.79 mg/dL (ref 0.44–1.00)
GFR calc Af Amer: >60 mL/min (ref >60)
GFR calc non Af Amer: >60 mL/min (ref >60)
Glucose, Bld: 94 mg/dL (ref 70–99)
Potassium: 3.9 mmol/L (ref 3.5–5.1)
Sodium: 138 mmol/L (ref 135–145)

## 2019-08-23 NOTE — ED Triage Notes (Signed)
Arms and legs feel numb. States she feels like she is going to pass out. She feels a bleep in her heart. States she knows she is not having an anxiety attack because she is not having heaviness in her chest like she does with anxiety.

## 2019-08-23 NOTE — ED Notes (Signed)
Voiced numbness to bilateral UE and Bilateral LE.

## 2019-08-23 NOTE — ED Provider Notes (Signed)
[facility] EMERGENCY DEPARTMENT Provider Note   CSN: [id number] Arrival date & time: 08/23/19  1430     History   Chief Complaint Chief Complaint  Patient presents with  . Numbness    HPI Rachel Oconnor is a 43 y.o. female with no significant past medical history other than hypertension     HPI Patient presents for bilateral arm and lower extremity numbness with fatigue, hiccups and fluttering sensation in her chest.  Patient states that approximately 1:30 PM she ate sushi with escargot for lunch which is unusual for her.  States she has no known history of allergies and has eaten sushi before [place] before but not for long time.  States that within several minutes after eating she started having numbness in her arms and noticed that when she was typing she was having some difficulty with decreased dexterity.  Patient states she also felt fatigued felt like her eyelids were drooping and states that things seemed a little blurry however denies any difficulty seeing objects, halos, blind areas, darkness, photophobia or eye pain.  Patient states that in route to ED she noticed that she had some bilateral lower leg numbness as well.  Also had episode of hiccups which is now abated.  Patient states she has no history of allergies.  Has reviewed hypertension but has been off her hydrochlorothiazide for several months because she has noticed her blood pressures being normal.  Patient denies any itching, rash, abdominal pain, nausea, wheezing or shortness of breath.  Denies any chest pain.  Denies any history of blood clots, cough, hemoptysis, recent trauma or surgery, on no OCPs or hormonal therapy    Past Medical History:  Diagnosis Date  . Allergy   . Depression   . Fibroids 2013  . Hypertension    no meds..pt states up last year  . Refusal of blood transfusions as patient is Jehovah's Witness 2013  . 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 Post-operative state 11/16/2018  . Postoperative state 11/16/2018  . Obesity (BMI 30-39.9) 12/28/2017  . Essential hypertension 03/16/2017  . Sleep apnea 07/12/2015  . Snoring 07/12/2015    Past Surgical History:  Procedure Laterality Date  . ABDOMINAL HYSTERECTOMY    . ABLATION     UTERUS  . davinci myomectomy  04/2011  . HERNIA REPAIR     INGUINAL  . MYOMECTOMY    . ROBOTIC ASSISTED LAPAROSCOPIC HYSTERECTOMY AND SALPINGECTOMY Bilateral 11/16/2018   Procedure: XI ROBOTIC ASSISTED TOTAL LAPAROSCOPIC HYSTERECTOMY AND SALPINGECTOMY;  Surgeon: Princess Bruins, MD;  Location: [facility];  Service: Gynecology;  Laterality: Bilateral;  request to follow first case in block [facility] Gyn/Dr. Dellis Filbert requests two hours needs a bed for overnight stay  . WISDOM TOOTH EXTRACTION       OB History    Gravida  0   Para  0   Term  0   Preterm  0   AB  0   Living  0     SAB  0   TAB  0   Ectopic  0   Multiple  0   Live Births  0            Home Medications    Prior to Admission medications   Medication Sig Start Date End Date Taking? Authorizing Provider  cyclobenzaprine (FLEXERIL) 10 MG tablet Take 0.5-1 tablets (5-10  mg total) by mouth 3 (three) times daily as needed for muscle spasms. 06/29/19   Shelda Pal, DO  fluticasone (FLONASE) 50 MCG/ACT nasal spray Place 2 sprays into both nostrils daily. 01/28/18   Saguier, Percell Miller, PA-C  hydrochlorothiazide (HYDRODIURIL) 25 MG tablet Take 25 mg by mouth daily. 10/22/18   [provider]  meloxicam (MOBIC) 15 MG tablet Take 1 tablet (15 mg total) by mouth daily. 06/29/19   Shelda Pal, DO  Turmeric POWD Take 1 Scoop by mouth 3 (three) times a week. Mix with hot tea and apple cider vinegar     [provider]    Family History Family History  Problem Relation Age of Onset  . Hypertension Mother   . Cancer Father    . Hypertension Maternal Grandmother   . Breast cancer Maternal Aunt   . Diabetes Maternal Aunt   . Breast cancer Paternal Aunt   . Cancer Paternal Aunt        colon  . Diabetes Paternal Aunt     Social History Social History   Tobacco Use  . Smoking status: Never Smoker  . Smokeless tobacco: Never Used  Substance Use Topics  . Alcohol use: Yes    Comment: wine WEEKENDS  . Drug use: No     Allergies   Amoxicillin and Other   Review of Systems Review of Systems  Constitutional: Positive for fatigue. Negative for appetite change, chills and fever.  HENT: Negative for congestion.   Respiratory: Negative for cough, chest tightness and shortness of breath.   Cardiovascular: Negative for chest pain.  Gastrointestinal: Negative for abdominal pain.  Genitourinary: Negative for pelvic pain.  Musculoskeletal: Negative for back pain and neck pain.  Neurological: Positive for numbness. Negative for dizziness, tremors, syncope, facial asymmetry, speech difficulty, light-headedness and headaches.     Physical Exam Updated Vital Signs BP (!) 157/100   Pulse 70   Temp 98.6 F (37 C) (Oral)   Resp 20   Ht 5' 7.5" (1.715 m)   Wt 85.7 kg   LMP 11/02/2018   SpO2 100%   BMI 29.16 kg/m   Physical Exam Vitals signs and nursing note reviewed.  Constitutional:      General: She is not in acute distress. HENT:     Head: Normocephalic and atraumatic.     Nose: Nose normal.  Eyes:     General: No scleral icterus. Neck:     Musculoskeletal: Normal range of motion.  Cardiovascular:     Rate and Rhythm: Normal rate and regular rhythm.     Pulses: Normal pulses.     Heart sounds: Normal heart sounds.  Pulmonary:     Effort: Pulmonary effort is normal. No respiratory distress.     Breath sounds: No wheezing.  Abdominal:     Palpations: Abdomen is soft.     Tenderness: There is no abdominal tenderness.  Musculoskeletal:     Right lower leg: No edema.     Left lower leg: No  edema.  Skin:    General: Skin is warm and dry.     Capillary Refill: Capillary refill takes less than 2 seconds.  Neurological:     Mental Status: She is alert. Mental status is at baseline.     Comments: Patient has intact sensation bilateral upper and lower extremities.  Specifically sensation is intact grossly to soft touch in the medial, radial and ulnar distribution of bilateral upper extremities.  Strength is symmetric.  Grip strength and  flexion extension of elbow 4/5 with poor effort.  Flexion extension of knees 5/5.  Plantar and dorsiflexion of ankles 5/5.  Bilateral lower extremity ankle reflexes intact  Gait normal  Cranial nerve exam is normal.  Psychiatric:        Mood and Affect: Mood normal.        Behavior: Behavior normal.      ED Treatments / Results  Labs (all labs ordered are listed, but only abnormal results are displayed) Labs Reviewed  CBC WITH DIFFERENTIAL/PLATELET - Abnormal; Notable for the following components:      Result Value   Platelets 419 (*)    All other components within normal limits  BASIC METABOLIC PANEL    EKG EKG Interpretation  Date/Time:  Tuesday August 23 2019 15:30:22 EST Ventricular Rate:  67 PR Interval:    QRS Duration: 79 QT Interval:  396 QTC Calculation: 418 R Axis:   26 Text Interpretation: Sinus rhythm When compared to prior, possible t wave inversion in lead 3. No STEMI Confirmed by Antony Blackbird [phone]) on 08/23/2019 4:25:29 PM   Radiology No results found.  Procedures Procedures (including critical care time)  Medications Ordered in ED Medications - No data to display   Initial Impression / Assessment and Plan / ED Course  I have reviewed the triage vital signs and the nursing notes.  Pertinent labs & imaging results that were available during my care of the patient were reviewed by me and considered in my medical decision making (see chart for details).        Differential includes anxiety  attack, MG, MS, hypertensive emergency, PRES, electrolyte abnormality.   BMP and CBC WNLs. EKG normal. Patient unchanged on reassessment. No worsening of symptoms. No distress. Continues to have normal neuro exam. Visual acuity is 20/40 however no double vision or acute vision changes. No HA. Doubt SAH, IPH or other central abnormality. Doubt MS as first occurrence and sudden onset.   Suspect scombroid poisoning as possible explanation. Doubt acute emergent cause of sx. Given strict return precautions and recommended benadryl 25mg  Q6 today and FU with PCP tomorrow. Discussed need for patient to begin taking her BP medication as prescribed. On my recheck BP was approximately 130/90. Other vitals WNL.   I discussed this case with my attending physician who cosigned this note including patient's presenting symptoms, physical exam, and planned diagnostics and interventions. Attending physician stated agreement with plan or made changes to plan which were implemented.      This patient appears reasonably screened and I doubt any other medical condition requiring further workup, evaluation, or treatment in the ED at this time prior to discharge.   Patient's vitals are WNL apart from vital sign abnormalities discussed above, patient is in NAD, and able to ambulate in the ED at their baseline. Pain has been managed or a plan has been made for home management and has no complaints prior to discharge. Patient is comfortable with above plan and is stable for discharge at this time. All questions were answered prior to disposition. Results from the ER workup discussed with the patient face to face and all questions answered to the best of my ability. The patient is safe for discharge with strict return precautions. Patient appears safe for discharge with appropriate follow-up. Conveyed my impression with the patient and they voiced understanding and are agreeable to plan.   An After Visit Summary was printed and  given to the patient.  Portions of this note were  generated with [employer]. Dictation errors may occur despite best attempts at proofreading.    Final Clinical Impressions(s) / ED Diagnoses   Final diagnoses:  Paresthesia    ED Discharge Orders    None       Tedd Sias, [place] 08/23/19 1636    Tegeler, Gwenyth Allegra, MD 08/23/19 2046

## 2019-08-23 NOTE — ED Notes (Signed)
ED Provider at bedside. 

## 2019-08-23 NOTE — Discharge Instructions (Signed)
Please follow up with your primary care doctor. Please take benadryl 25 mg every 6 hours for today and arrange an appointment with your doctor.

## 2019-08-24 ENCOUNTER — Ambulatory Visit
Admission: RE | Admit: 2019-08-24 | Discharge: 2019-08-24 | Disposition: A | Discharge status: Home / Self Care | Payer: 59 | Source: Ambulatory Visit | Attending: Family Medicine | Admitting: Family Medicine

## 2019-08-24 ENCOUNTER — Ambulatory Visit: Payer: 59

## 2019-08-24 DIAGNOSIS — R928 Other abnormal and inconclusive findings on diagnostic imaging of breast: Secondary | ICD-10-CM

## 2019-10-06 ENCOUNTER — Encounter: Payer: Self-pay | Admitting: Medical

## 2019-10-06 ENCOUNTER — Ambulatory Visit (INDEPENDENT_AMBULATORY_CARE_PROVIDER_SITE_OTHER): Payer: 59 | Admitting: Medical

## 2019-10-06 VITALS — BP 131/89 | Temp 97.9°F | Ht 67.5 in | Wt 178.0 lb

## 2019-10-06 DIAGNOSIS — J3489 Other specified disorders of nose and nasal sinuses: Secondary | ICD-10-CM

## 2019-10-06 DIAGNOSIS — J301 Allergic rhinitis due to pollen: Secondary | ICD-10-CM

## 2019-10-06 DIAGNOSIS — H9201 Otalgia, right ear: Secondary | ICD-10-CM

## 2019-10-06 MED ORDER — NEOMYCIN-POLYMYXIN-HC 3.5-10000-1 OT SOLN: 3.0000 [drp] | Freq: Four times a day (QID) | OTIC | 0 refills | Status: DC

## 2019-10-06 MED ORDER — FLUTICASONE PROPIONATE 50 MCG/ACT NA SUSP: 2.0000 | Freq: Every day | NASAL | 1 refills | Status: DC

## 2019-10-06 MED ORDER — AZITHROMYCIN 250 MG PO TABS: ORAL_TABLET | ORAL | 0 refills | Status: DC

## 2019-10-06 NOTE — Progress Notes (Signed)
   Subjective:    Patient ID: Rachel Oconnor, female    DOB: 02-01-1976, 44 y.o.   MRN: [id number]  HPI  Virtual Visit via Telephone Note  I connected with Rachel Oconnor on 10/06/19 at  9:40 AM EST by telephone and verified that I am speaking with the correct person using two identifiers.  Location: Patient: home Provider: office   I discussed the limitations, risks, security and privacy concerns of performing an evaluation and management service by telephone and the availability of in person appointments. I also discussed with the patient that there may be a patient responsible charge related to this service. The patient expressed understanding and agreed to proceed.   History of Present Illness: Pt states she had ha on Sunday and Monday. She describes pain in frontal and maxillary sinus area. Tuesday that went away completley. Then yesterday evening got ear pain that was constant and severe. Pain is still present. Pain present on swallowing. Pt has some pnd. Hx of allergies year round.     Observations/Objective: General- no acute distress, pleasant, alert and oriented. Normal speech.   Assessment and Plan: Presently has some symptoms of possible possible sinus infection and ear infection.  History of some allergic rhinitis as well.  We will go ahead and prescribe a azithromycin antibiotic, Flonase nasal spray and Cortisporin otic drops.   Patient is working from home and I counseled for her to quarantine over the next 4 days to see how she responds to above treatment.  If her symptoms persist or worsen then advised would recommend getting Covid test.  Presently I do not think that is indicated.  Follow-up in 7 days or as needed.  But did ask to give me a my chart update in about 4 days. Mackie Pai, PA-C  Follow Up Instructions:    I discussed the assessment and treatment plan with the patient. The patient was provided an opportunity to ask questions and all were answered. The  patient agreed with the plan and demonstrated an understanding of the instructions.   The patient was advised to call back or seek an in-person evaluation if the symptoms worsen or if the condition fails to improve as anticipated.  I provided 25 minutes of non-face-to-face time during this encounter.   Mackie Pai, PA-C   Review of Systems  Constitutional: Negative for chills, fatigue and fever.  HENT: Positive for ear pain, postnasal drip and sinus pressure. Negative for sneezing and sore throat.        No change in sense of smell.  Respiratory: Negative for cough, chest tightness, shortness of breath and wheezing.   Cardiovascular: Negative for chest pain and palpitations.  Gastrointestinal: Negative for abdominal pain.  Musculoskeletal: Negative for back pain and myalgias.  Skin: Negative for rash.  Neurological: Negative for dizziness, weakness and light-headedness.  Hematological: Negative for adenopathy. Does not bruise/bleed easily.  Psychiatric/Behavioral: The patient is not nervous/anxious.        Objective:   Physical Exam        Assessment & Plan:

## 2019-10-06 NOTE — Patient Instructions (Addendum)
Presently has some symptoms of possible possible sinus infection and ear infection.  History of some allergic rhinitis as well.  We will go ahead and prescribe a azithromycin antibiotic, Flonase nasal spray and Cortisporin otic drops.   Patient is working from home and I counseled for her to quarantine over the next 4 days to see how she responds to above treatment.  If her symptoms persist or worsen then advised would recommend getting Covid test.  Presently I do not think that is indicated.  Follow-up in 7 days or as needed.  But did ask to give me a my chart update in about 4 days.

## 2020-06-14 IMAGING — DX DG HIP (WITH OR WITHOUT PELVIS) 2-3V*L*
3 series · 3 of 3 positions shown · non-contrast
Comparison: None.

CLINICAL DATA: Acute left hip pain without known injury.

EXAM:
DG HIP (WITH OR WITHOUT PELVIS) 2-3V LEFT

[pelvis ap]
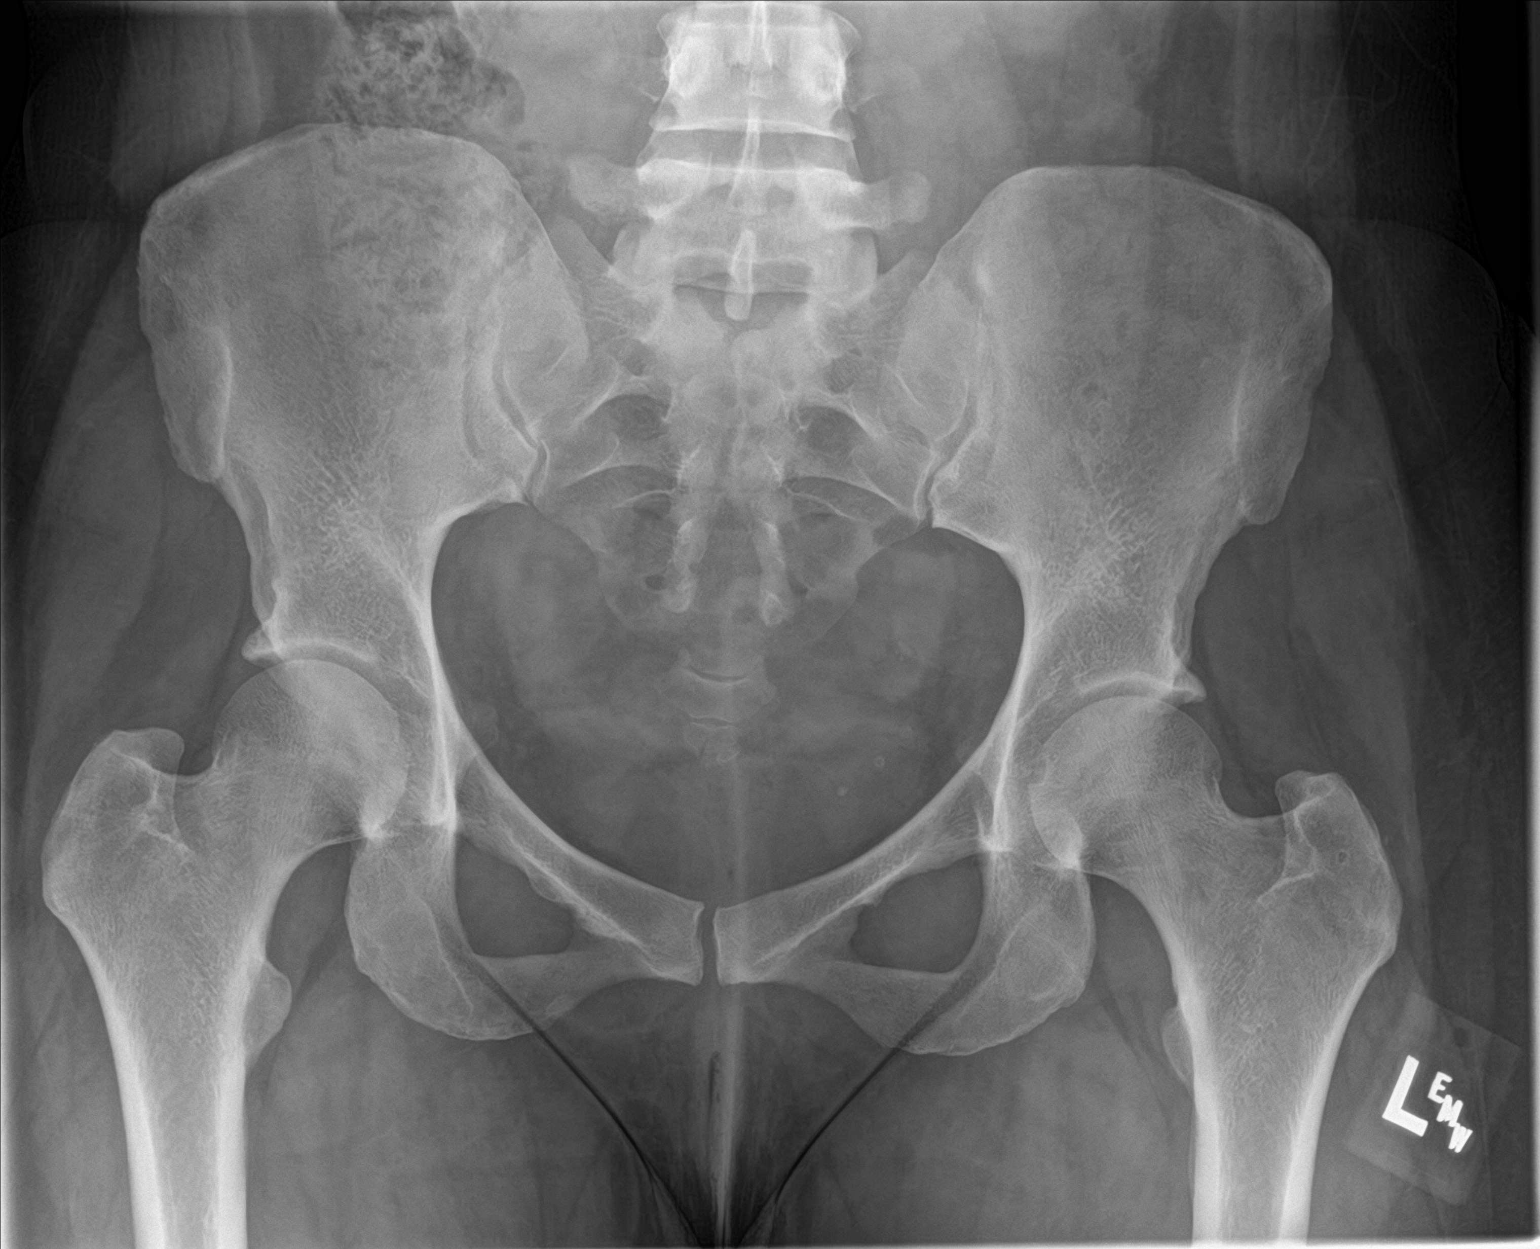

[hip ap]
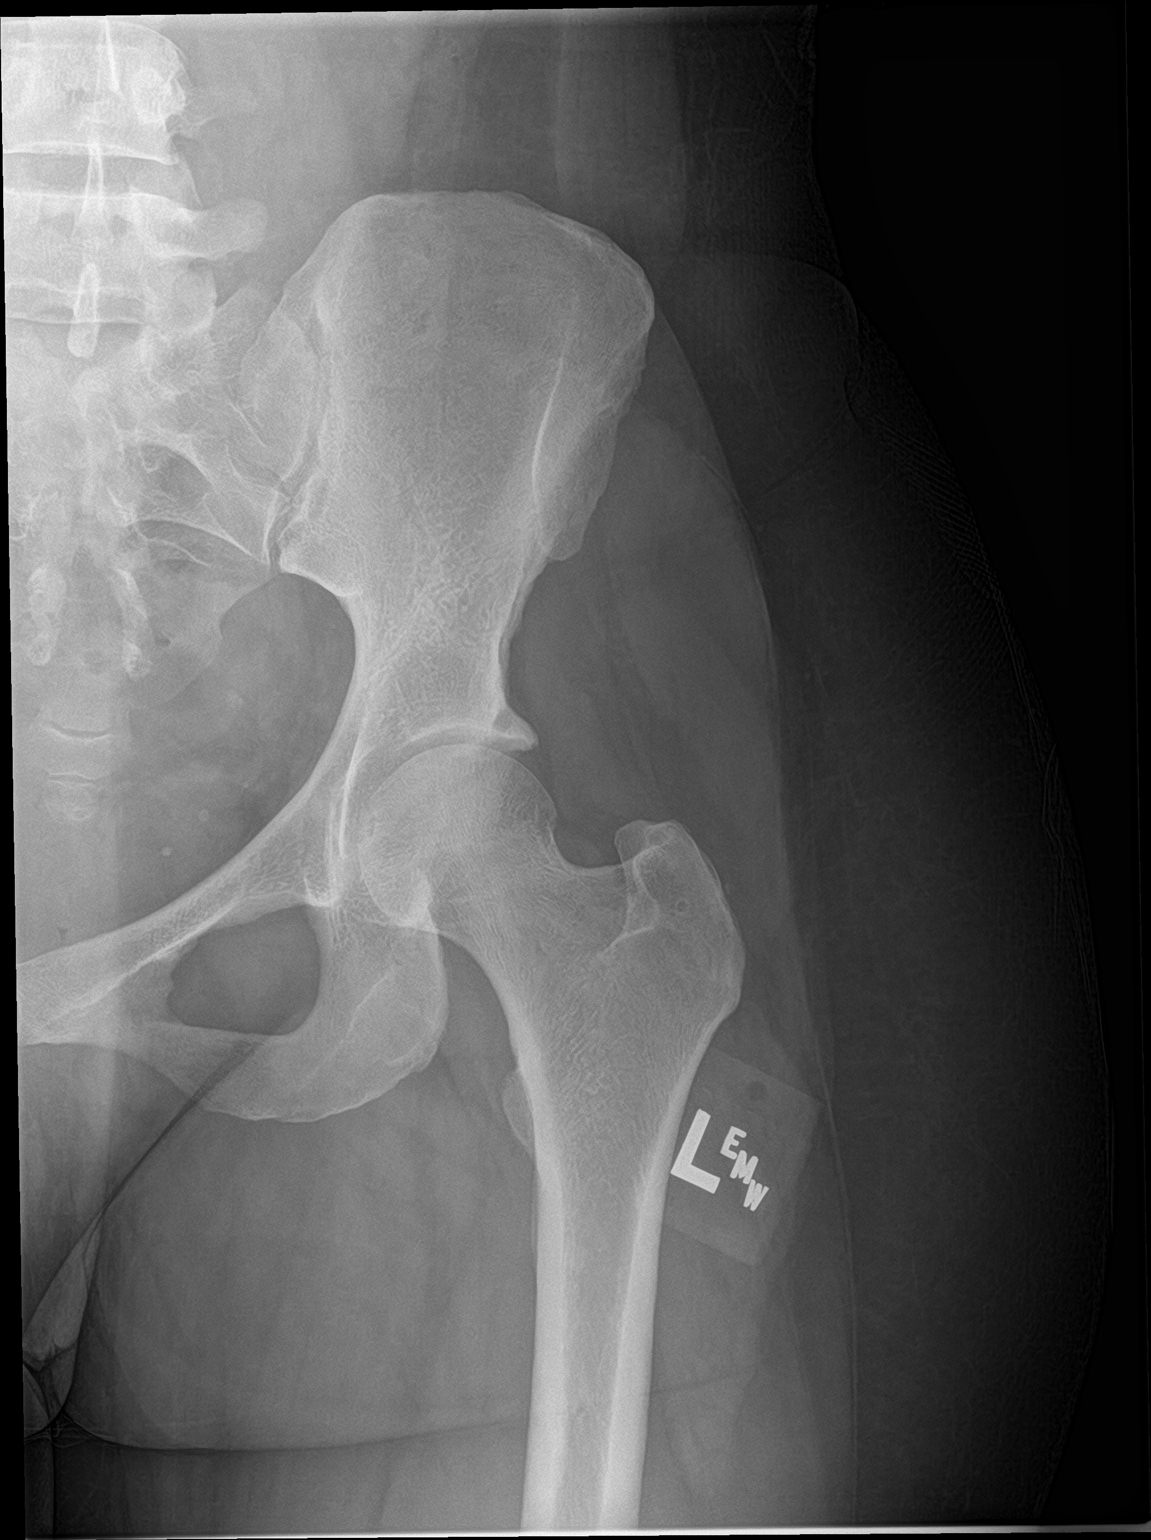

[hip lat]
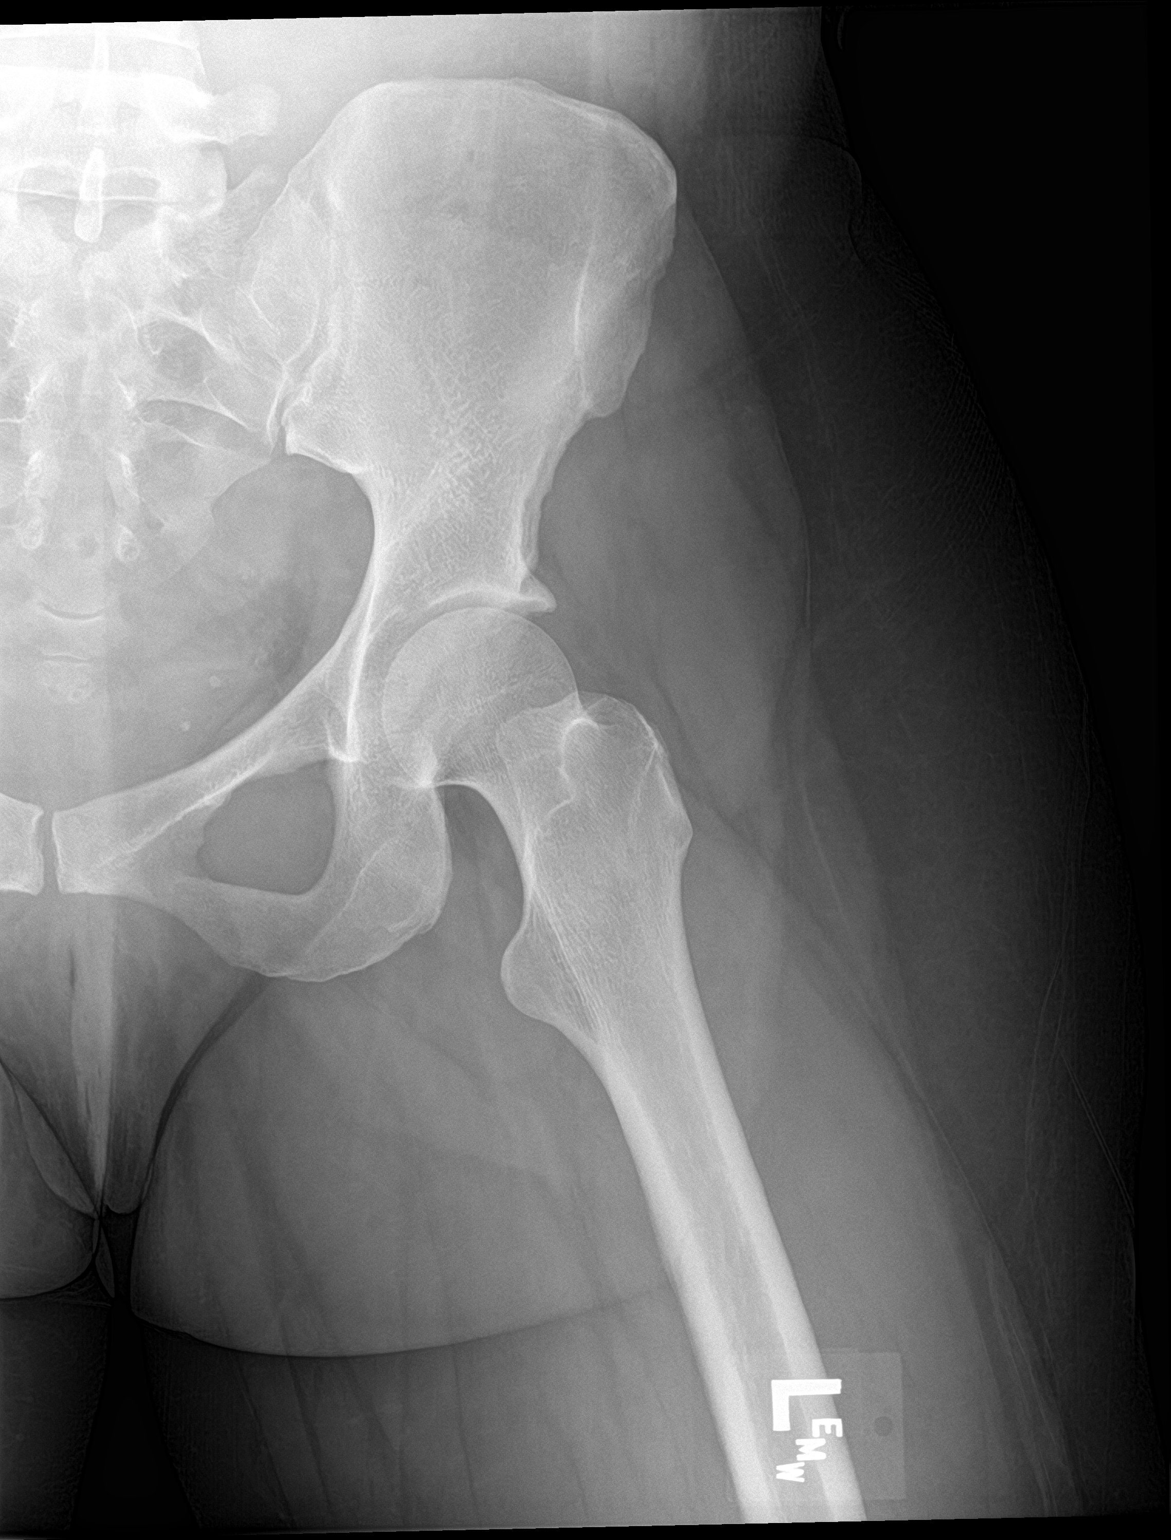

[3 of 3 positions shown; findings below may reference images not displayed]

FINDINGS: There is no evidence of hip fracture or dislocation. There is no
evidence of arthropathy or other focal bone abnormality.
IMPRESSION: Negative.

## 2020-09-27 ENCOUNTER — Other Ambulatory Visit: Payer: Self-pay

## 2020-09-28 ENCOUNTER — Encounter: Payer: Self-pay | Admitting: Family Medicine

## 2020-09-28 ENCOUNTER — Ambulatory Visit: Payer: 59 | Admitting: Family Medicine

## 2020-09-28 VITALS — BP 108/68 | HR 68 | Temp 98.4°F | Ht 67.0 in | Wt 180.0 lb

## 2020-09-28 DIAGNOSIS — Z789 Other specified health status: Secondary | ICD-10-CM | POA: Diagnosis not present

## 2020-09-28 DIAGNOSIS — F411 Generalized anxiety disorder: Secondary | ICD-10-CM

## 2020-09-28 LAB — HEPATIC FUNCTION PANEL
ALT: 13 U/L (ref 0–35)
AST: 15 U/L (ref 0–37)
Albumin: 4.5 g/dL (ref 3.5–5.2)
Alkaline Phosphatase: 48 U/L (ref 39–117)
Bilirubin, Direct: 0.1 mg/dL (ref 0.0–0.3)
Total Bilirubin: 0.7 mg/dL (ref 0.2–1.2)
Total Protein: 7.3 g/dL (ref 6.0–8.3)

## 2020-09-28 MED ORDER — SERTRALINE HCL 50 MG PO TABS: 50.0000 mg | ORAL_TABLET | Freq: Every day | ORAL | 3 refills | Status: DC

## 2020-09-28 NOTE — Progress Notes (Signed)
Chief Complaint  Patient presents with  . Anxiety    Labs    Subjective Rachel Oconnor presents for f/u anxiety. Starting to have increased anxiety over past 6 mo.  She is having insomnia, racing thoughts, anxiety, decreased concentration and chest pressure (not exertional).  No thoughts of harming self or others. No self-medication with prescription drugs or illicit drugs.  She is drinking 1 bottle of wine every 2 days.  She is on the treadmill, considering some HIIT, wt lifting She does not get chest pain/pressure when she exercises.  Pt is following with a marriage counselor and an Secretary/administrator. Has been doing for the past year.   Past Medical History:  Diagnosis Date  . Allergy   . Depression   . Fibroids 2013  . Hypertension    no meds..pt states up last year  . Refusal of blood transfusions as patient is Jehovah's Witness 2013  . 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 No Did it involve sudden or severe rash/hives, skin peeling, or any reaction on the inside of your mouth or nose? No Did you need to seek medical attention at a hospital or doctor's office? No When did it last happen?20 years ago If all above answers are "NO", may proceed with cephalosporin use.   Other    No blood products      Medication List       Accurate as of September 28, 2020  7:40 AM. If you have any questions, ask your nurse or doctor.        STOP taking these medications   azithromycin 250 MG tablet Commonly known as: ZITHROMAX Stopped by: Shelda Pal, DO   cyclobenzaprine 10 MG tablet Commonly known as: FLEXERIL Stopped by: Shelda Pal, DO   fluticasone 50 MCG/ACT nasal spray Commonly known as: FLONASE Stopped by: Shelda Pal, DO   hydrochlorothiazide 25 MG  tablet Commonly known as: HYDRODIURIL Stopped by: Shelda Pal, DO   meloxicam 15 MG tablet Commonly known as: MOBIC Stopped by: Shelda Pal, DO   neomycin-polymyxin-hydrocortisone OTIC solution Commonly known as: CORTISPORIN Stopped by: Shelda Pal, DO     TAKE these medications   sertraline 50 MG tablet Commonly known as: ZOLOFT Take 1 tablet (50 mg total) by mouth daily. Take 1/2 tab daily for first 2 weeks. Started by: Shelda Pal, DO   Turmeric Powd Take 1 Scoop by mouth 3 (three) times a week. Mix with hot tea and apple cider vinegar       Exam BP 108/68 (BP Location: Left Arm, Patient Position: Sitting, Cuff Size: Normal)   Pulse 68   Temp 98.4 F (36.9 C) (Oral)   Ht 5\' 7"  (1.702 m)   Wt 180 lb (81.6 kg)   LMP 11/02/2018   SpO2 98%   BMI 28.19 kg/m  General:  well developed, well nourished, in no apparent distress Lungs:  CTAB. No respiratory distress Heart: RRR, No LE edema Psych: well oriented with normal range of affect and age-appropriate judgement/insight, alert and oriented x4.  Assessment and Plan  GAD (generalized anxiety disorder) - Plan: sertraline (ZOLOFT) 50 MG tablet  Alcohol consumption of one to four drinks per day - Plan: Hepatic function panel  Status: Uncontrolled, new. Cont w counseling  team. Sleep hygiene info provided. Anxiety coping tech's provided. Cont exercising. Start Zoloft 25 mg/d for 2 weeks and increase to 50 mg/d. Will see her back in 6 weeks. She will let me know if she has any issues w med sooner. Ck hep function panel given her drinking. Advised to wean down, which she knows she needs to do.  The patient voiced understanding and agreement to the plan.  [place], DO 09/28/20 7:40 AM

## 2020-09-28 NOTE — Patient Instructions (Addendum)
Please consider counseling. Contact [phone] to schedule an appointment or inquire about cost/insurance coverage.  Give [place] 2-3 business days to get the results of your labs back.   Sleep Hygiene Tips:  Do not watch TV or look at screens within 1 hour of going to bed. If you do, make sure there is a blue light filter (nighttime mode) involved.  Try to go to bed around the same time every night. Wake up at the same time within 1 hour of regular time. Ex: If you wake up at 7 AM for work, do not sleep past 8 AM on days that you don't work.  Do not drink alcohol before bedtime.  Do not consume caffeine-containing beverages after noon or within 9 hours of intended bedtime.  Get regular exercise/physical activity in your life, but not within 2 hours of planned bedtime.  Do not take naps.   Do not eat within 2 hours of planned bedtime.  Melatonin, 3-5 mg 30-60 minutes before planned bedtime may be helpful.   The bed should be for sleep or sex only. If after 20-30 minutes you are unable to fall asleep, get up and do something relaxing. Do this until you feel ready to go to sleep again.   Aim to do some physical exertion for 150 minutes per week. This is typically divided into 5 days per week, 30 minutes per day. The activity should be enough to get your heart rate up. Anything is better than nothing if you have time constraints.  Coping skills Choose 5 that work for you:  Take a deep breath  Count to 20  Read a book  Do a puzzle  Meditate  Bake  Sing  Knit  Garden  Pray  Go outside  Call a friend  Listen to music  Take a walk  Color  Send a note  Take a bath  Watch a movie  Be alone in a quiet place  Pet an animal  Visit a friend  Journal  Exercise  Stretch

## 2020-11-08 ENCOUNTER — Other Ambulatory Visit: Payer: Self-pay | Admitting: Family Medicine

## 2020-11-08 DIAGNOSIS — Z0000 Encounter for general adult medical examination without abnormal findings: Secondary | ICD-10-CM

## 2020-11-09 ENCOUNTER — Ambulatory Visit: Payer: 59 | Admitting: Family Medicine

## 2020-11-09 DIAGNOSIS — Z0289 Encounter for other administrative examinations: Secondary | ICD-10-CM

## 2020-11-17 ENCOUNTER — Other Ambulatory Visit: Payer: Self-pay

## 2020-11-17 ENCOUNTER — Ambulatory Visit
Admission: RE | Admit: 2020-11-17 | Discharge: 2020-11-17 | Disposition: A | Discharge status: Home / Self Care | Payer: 59 | Source: Ambulatory Visit | Attending: Family Medicine | Admitting: Family Medicine

## 2020-11-17 DIAGNOSIS — Z0000 Encounter for general adult medical examination without abnormal findings: Secondary | ICD-10-CM

## 2020-11-21 ENCOUNTER — Other Ambulatory Visit: Payer: Self-pay | Admitting: Family Medicine

## 2020-11-21 DIAGNOSIS — R928 Other abnormal and inconclusive findings on diagnostic imaging of breast: Secondary | ICD-10-CM

## 2020-11-26 ENCOUNTER — Telehealth: Payer: Self-pay | Admitting: Family Medicine

## 2020-11-26 NOTE — Telephone Encounter (Signed)
Medication: sertraline (ZOLOFT) 50 MG tablet [[id number]]   Has the patient contacted their pharmacy? No. (If no, request that the patient contact the pharmacy for the refill.) (If yes, when and what did the pharmacy advise?)  Preferred Pharmacy (with phone number or street name): [facility]  [place], [place] [address]  Phone:  [phone] Fax:  [phone]  DEA #:  --  Agent: Please be advised that RX refills may take up to 3 business days. We ask that you follow-up with your pharmacy.

## 2020-11-27 NOTE — Telephone Encounter (Signed)
Scheduled

## 2020-11-27 NOTE — Telephone Encounter (Signed)
I spoke with pt she says she actually is asking for a refill of Phentermine. She says she hasnt taken this since 2019 but that it was discussed at her appointment a couple weeks ago.

## 2020-11-27 NOTE — Telephone Encounter (Signed)
Sched appt plz. She was supposed to f/u in mid Feb but never did. We can address then. Ty.

## 2020-11-28 ENCOUNTER — Other Ambulatory Visit: Payer: Self-pay

## 2020-11-28 ENCOUNTER — Ambulatory Visit: Payer: 59 | Admitting: Family Medicine

## 2020-11-28 ENCOUNTER — Encounter: Payer: Self-pay | Admitting: Family Medicine

## 2020-11-28 VITALS — BP 132/86 | HR 89 | Temp 98.0°F | Resp 16 | Wt 189.2 lb

## 2020-11-28 DIAGNOSIS — E663 Overweight: Secondary | ICD-10-CM

## 2020-11-28 DIAGNOSIS — F411 Generalized anxiety disorder: Secondary | ICD-10-CM

## 2020-11-28 MED ORDER — PHENTERMINE HCL 37.5 MG PO TABS: ORAL_TABLET | ORAL | 0 refills | Status: DC

## 2020-11-28 MED ORDER — PHENTERMINE HCL 37.5 MG PO TABS: 37.5000 mg | ORAL_TABLET | Freq: Every day | ORAL | 0 refills | Status: DC

## 2020-11-28 NOTE — Patient Instructions (Signed)
Take the Zoloft daily. It can take 2-6 weeks to kick in.  Use GoodRx for the Adipex if insurance does not cover.   Try to get around 65-70 g of protein daily.   Healthy Eating Plan Many factors influence your heart health, including eating and exercise habits. Heart (coronary) risk increases with abnormal blood fat (lipid) levels. Heart-healthy meal planning includes limiting unhealthy fats, increasing healthy fats, and making other small dietary changes. This includes maintaining a healthy body weight to help keep lipid levels within a normal range.  WHAT IS MY PLAN?  Your health care provider recommends that you:  Drink a glass of water before meals to help with satiety.  Eat slowly.  An alternative to the water is to add Metamucil. This will help with satiety as well. It does contain calories, unlike water.  WHAT TYPES OF FAT SHOULD I CHOOSE?  Choose healthy fats more often. Choose monounsaturated and polyunsaturated fats, such as olive oil and canola oil, flaxseeds, walnuts, almonds, and seeds.  Eat more omega-3 fats. Good choices include salmon, mackerel, sardines, tuna, flaxseed oil, and ground flaxseeds. Aim to eat fish at least two times each week.  Avoid foods with partially hydrogenated oils in them. These contain trans fats. Examples of foods that contain trans fats are stick margarine, some tub margarines, cookies, crackers, and other baked goods. If you are going to avoid a fat, this is the one to avoid!  WHAT GENERAL GUIDELINES DO I NEED TO FOLLOW?  Check food labels carefully to identify foods with trans fats. Avoid these types of options when possible.  Fill one half of your plate with vegetables and green salads. Eat 4-5 servings of vegetables per day. A serving of vegetables equals 1 cup of raw leafy vegetables,  cup of raw or cooked cut-up vegetables, or  cup of vegetable juice.  Fill one fourth of your plate with whole grains. Look for the word "whole" as the  first word in the ingredient list.  Fill one fourth of your plate with lean protein foods.  Eat 4-5 servings of fruit per day. A serving of fruit equals one medium whole fruit,  cup of dried fruit,  cup of fresh, frozen, or canned fruit. Try to avoid fruits in cups/syrups as the sugar content can be high.  Eat more foods that contain soluble fiber. Examples of foods that contain this type of fiber are apples, broccoli, carrots, beans, peas, and barley. Aim to get 20-30 g of fiber per day.  Eat more home-cooked food and less restaurant, buffet, and fast food.  Limit or avoid alcohol.  Limit foods that are high in starch and sugar.  Avoid fried foods when able.  Cook foods by using methods other than frying. Baking, boiling, grilling, and broiling are all great options. Other fat-reducing suggestions include: ? Removing the skin from poultry. ? Removing all visible fats from meats. ? Skimming the fat off of stews, soups, and gravies before serving them. ? Steaming vegetables in water or broth.  Lose weight if you are overweight. Losing just 5-10% of your initial body weight can help your overall health and prevent diseases such as diabetes and heart disease.  Increase your consumption of nuts, legumes, and seeds to 4-5 servings per week. One serving of dried beans or legumes equals  cup after being cooked, one serving of nuts equals 1 ounces, and one serving of seeds equals  ounce or 1 tablespoon.  WHAT ARE GOOD FOODS CAN I EAT?  Grains Grainy breads (try to find bread that is 3 g of fiber per slice or greater), oatmeal, light popcorn. Whole-grain cereals. Rice and pasta, including brown rice and those that are made with whole wheat. Edamame pasta is a great alternative to grain pasta. It has a higher protein content. Try to avoid significant consumption of white bread, sugary cereals, or pastries/baked goods.  Vegetables All vegetables. Cooked white potatoes do not count as  vegetables.  Fruits All fruits, but limit pineapple and bananas as these fruits have a higher sugar content.  Meats and Other Protein Sources Lean, well-trimmed beef, veal, pork, and lamb. Chicken and [place] without skin. All fish and shellfish. Wild duck, rabbit, pheasant, and venison. Egg whites or low-cholesterol egg substitutes. Dried beans, peas, lentils, and tofu.Seeds and most nuts.  Dairy Low-fat or nonfat cheeses, including ricotta, string, and mozzarella. Skim or 1% milk that is liquid, powdered, or evaporated. Buttermilk that is made with low-fat milk. Nonfat or low-fat yogurt. Soy/Almond milk are good alternatives if you cannot handle dairy.  Beverages Water is the best for you. Sports drinks with less sugar are more desirable unless you are a highly active athlete.  Sweets and Desserts Sherbets and fruit ices. Honey, jam, marmalade, jelly, and syrups. Dark chocolate.  Eat all sweets and desserts in moderation.  Fats and Oils Nonhydrogenated (trans-free) margarines. Vegetable oils, including soybean, sesame, sunflower, olive, peanut, safflower, corn, canola, and cottonseed. Salad dressings or mayonnaise that are made with a vegetable oil. Limit added fats and oils that you use for cooking, baking, salads, and as spreads.  Other Cocoa powder. Coffee and tea. Most condiments.  The items listed above may not be a complete list of recommended foods or beverages. Contact your dietitian for more options.

## 2020-11-28 NOTE — Progress Notes (Signed)
Chief Complaint  Patient presents with  . Follow-up  . mood  . weight loss treatment    Subjective: Patient is a 45 y.o. female here for f/u.  Patient is following up for generalized anxiety.  She was started on sertraline 50 mg daily.  She took half a tab for 3 days and then stopped because it was not helpful.  She is not currently following with a counselor or psychologist.  No homicidal or suicidal ideation.  No self-medication.  Patient has a history of being overweight/obese.  She was on Adipex in the past and reported doing well.  She would like a refill on this.  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  Her diet is overall poor.  She struggles with options for foods that are healthy.  She exercises routinely with cardio and some weight resistance exercise.  Past Medical History:  Diagnosis Date  . Allergy   . Depression   . Fibroids 2013  . Hypertension    no meds..pt states up last year  . Refusal of blood transfusions as patient is Jehovah's Witness 2013  . 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 No accessory muscle use Psych: Age appropriate judgment and insight, normal affect and mood  Assessment and Plan: GAD (generalized anxiety disorder)  Overweight (BMI 25.0-29.9)  1.  Restart Zoloft 25 mg daily for 2 weeks and then increase to 50 mg daily.  Counseled on importance of staying on this medication. 2.  93-month trial of Adipex.  She will use good Rx if not covered by insurance. I will see her in 6 weeks. The patient voiced understanding and agreement to the plan.  [place], DO 11/28/20  12:03 PM

## 2020-12-06 ENCOUNTER — Other Ambulatory Visit: Payer: Self-pay

## 2020-12-06 ENCOUNTER — Ambulatory Visit
Admission: RE | Admit: 2020-12-06 | Discharge: 2020-12-06 | Disposition: A | Discharge status: Home / Self Care | Payer: 59 | Source: Ambulatory Visit | Attending: Family Medicine | Admitting: Family Medicine

## 2020-12-06 ENCOUNTER — Other Ambulatory Visit: Payer: Self-pay | Admitting: Family Medicine

## 2020-12-06 DIAGNOSIS — R928 Other abnormal and inconclusive findings on diagnostic imaging of breast: Secondary | ICD-10-CM

## 2020-12-26 ENCOUNTER — Telehealth (INDEPENDENT_AMBULATORY_CARE_PROVIDER_SITE_OTHER): Payer: 59 | Admitting: Family Medicine

## 2020-12-26 ENCOUNTER — Encounter: Payer: Self-pay | Admitting: Family Medicine

## 2020-12-26 ENCOUNTER — Other Ambulatory Visit: Payer: Self-pay

## 2020-12-26 DIAGNOSIS — J0100 Acute maxillary sinusitis, unspecified: Secondary | ICD-10-CM | POA: Diagnosis not present

## 2020-12-26 MED ORDER — PREDNISONE 20 MG PO TABS
40.0000 mg | ORAL_TABLET | Freq: Every day | ORAL | 0 refills | Status: AC
Start: 2020-12-26 — End: 2020-12-31

## 2020-12-26 NOTE — Progress Notes (Signed)
Chief Complaint  Patient presents with  . Cough    Nasal congestion Eye pressure Productive cough  . Sinusitis    Covid test 12/25/20 was negative    Iven Finn here for URI complaints. Due to COVID-19 pandemic, we are interacting via web portal for an electronic face-to-face visit. I verified patient's ID using 2 identifiers. Patient agreed to proceed with visit via this method. Patient is at home, I am at office. Patient and I are present for visit.   Duration: 2 days  Associated symptoms: sinus headache, sinus congestion, sinus pain, rhinorrhea, ear pain and cough Denies: itchy watery eyes, ear drainage, sore throat, wheezing, shortness of breath, myalgia and N/V/D Treatment to date: loratadine, decongestant Sick contacts: No  Neg covid test.    Past Medical History:  Diagnosis Date  . Allergy   . Depression   . Fibroids 2013  . Hypertension    no meds..pt states up last year  . Refusal of blood transfusions as patient is Jehovah's Witness 2013  . 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 Send message if no better.  Continue to push fluids, practice good hand hygiene, cover mouth when coughing. F/u prn. If starting to experience fevers, shaking, or shortness of breath, seek immediate care. Pt voiced understanding and agreement to the plan.  [place], DO 12/26/20 10:23 AM

## 2021-01-09 ENCOUNTER — Telehealth (INDEPENDENT_AMBULATORY_CARE_PROVIDER_SITE_OTHER): Payer: 59 | Admitting: Family Medicine

## 2021-01-09 ENCOUNTER — Encounter: Payer: Self-pay | Admitting: Family Medicine

## 2021-01-09 ENCOUNTER — Other Ambulatory Visit: Payer: Self-pay

## 2021-01-09 DIAGNOSIS — F411 Generalized anxiety disorder: Secondary | ICD-10-CM | POA: Diagnosis not present

## 2021-01-09 DIAGNOSIS — J301 Allergic rhinitis due to pollen: Secondary | ICD-10-CM | POA: Insufficient documentation

## 2021-01-09 DIAGNOSIS — E669 Obesity, unspecified: Secondary | ICD-10-CM

## 2021-01-09 MED ORDER — SERTRALINE HCL 50 MG PO TABS: 50.0000 mg | ORAL_TABLET | Freq: Every day | ORAL | 1 refills | Status: DC

## 2021-01-09 MED ORDER — MONTELUKAST SODIUM 10 MG PO TABS: 10.0000 mg | ORAL_TABLET | Freq: Every day | ORAL | 3 refills | Status: DC

## 2021-01-09 MED ORDER — LEVOCETIRIZINE DIHYDROCHLORIDE 5 MG PO TABS
5.0000 mg | ORAL_TABLET | Freq: Every evening | ORAL | 3 refills | Status: DC
Start: 2021-01-09 — End: 2022-12-10

## 2021-01-09 NOTE — Progress Notes (Signed)
Chief Complaint  Patient presents with  . Follow-up  . Allergic Rhinitis     Subjective Rachel Oconnor presents for f/u anxiety. Due to COVID-19 pandemic, we are interacting via web portal for an electronic face-to-face visit. I verified patient's ID using 2 identifiers. Patient agreed to proceed with visit via this method. Patient is at home, I am at office. Patient and I are present for visit.   Pt is currently being treated with Zoloft 50 mg/d.  Reports doing well since treatment. She is not always compliant, remembers to take about half the time. No thoughts of harming self or others. No self-medication with alcohol, prescription drugs or illicit drugs. Pt is not following with a counselor/psychologist.  Patient was following up for obesity.  She was prescribed phentermine at the last visit but has not started it yet.  She plans to start next week.  Patient's been dealing with seasonal allergies, particularly during spring when pollen is out.  She has been taking loratadine, loratadine with decongestant, and cetirizine.  When she takes 2 loratadine that does seem to help.  She was prescribed Singulair and Xyzal in the past that was helpful.  She is requesting his medications.  Past Medical History:  Diagnosis Date  . Allergy   . Depression   . Fibroids 2013  . Hypertension    no meds..pt states up last year  . Refusal of blood transfusions as patient is Jehovah's Witness 2013  . 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 No Did it involve sudden or severe rash/hives, skin peeling, or any reaction on the inside of your mouth or nose? No Did you need to seek medical attention at a hospital or doctor's office? No When did it last happen?20 years ago If all above answers are "NO", may proceed with cephalosporin use.    Other    No blood products      Medication List       Accurate as of January 09, 2021  9:43 AM. If you have any questions, ask your nurse or doctor.        levocetirizine 5 MG tablet Commonly known as: XYZAL Take 1 tablet (5 mg total) by mouth every evening. Started by: Crosby Oyster Relda Agosto, DO   montelukast 10 MG tablet Commonly known as: SINGULAIR Take 1 tablet (10 mg total) by mouth at bedtime. Started by: Shelda Pal, DO   phentermine 37.5 MG tablet Commonly known as: ADIPEX-P TAKE 1 TABLET BY MOUTH BEFORE BREAKFAST   phentermine 37.5 MG tablet Commonly known as: Adipex-P Take 1 tablet (37.5 mg total) by mouth daily before breakfast.   phentermine 37.5 MG tablet Commonly known as: Adipex-P Take 1 tablet (37.5 mg total) by mouth daily before breakfast. Start taking on: Jan 27, 2021   sertraline 50 MG tablet Commonly known as: ZOLOFT Take 1 tablet (50 mg total) by mouth daily. What changed: additional instructions Changed by: Shelda Pal, DO   Turmeric Powd Take 1 Scoop by mouth 3 (three) times a week. Mix with hot tea and apple cider vinegar       Exam LMP 11/02/2018  No conversational dyspnea Age appropriate judgment and insight Nml affect and mood  Assessment and Plan  GAD (generalized anxiety disorder) - Plan: sertraline (ZOLOFT) 50 MG  tablet  Obesity (BMI 30-39.9)  Seasonal allergic rhinitis due to pollen - Plan: levocetirizine (XYZAL) 5 MG tablet, montelukast (SINGULAIR) 10 MG tablet  1.  Continue Zoloft 50 mg daily. 2.  Counseled on diet and exercise.  She will start Adipex next week. 3.  Restart montelukast and Xyzal.  Stop loratadine and cetirizine. F/u in 6 weeks. The patient voiced understanding and agreement to the plan.  [place], DO 01/09/21 9:43 AM

## 2021-06-07 ENCOUNTER — Other Ambulatory Visit: Payer: Self-pay | Admitting: Family Medicine

## 2021-06-07 DIAGNOSIS — R928 Other abnormal and inconclusive findings on diagnostic imaging of breast: Secondary | ICD-10-CM

## 2021-06-10 ENCOUNTER — Other Ambulatory Visit: Payer: Self-pay

## 2021-06-10 ENCOUNTER — Ambulatory Visit
Admission: RE | Admit: 2021-06-10 | Discharge: 2021-06-10 | Disposition: A | Discharge status: Home / Self Care | Payer: 59 | Source: Ambulatory Visit | Attending: Family Medicine | Admitting: Family Medicine

## 2021-06-10 ENCOUNTER — Other Ambulatory Visit: Payer: Self-pay | Admitting: Family Medicine

## 2021-06-10 DIAGNOSIS — R928 Other abnormal and inconclusive findings on diagnostic imaging of breast: Secondary | ICD-10-CM

## 2021-08-26 ENCOUNTER — Ambulatory Visit: Payer: 59 | Admitting: Family

## 2021-08-26 VITALS — BP 144/88 | HR 72 | Temp 98.6°F | Resp 16 | Ht 67.0 in | Wt 184.0 lb

## 2021-08-26 DIAGNOSIS — R300 Dysuria: Secondary | ICD-10-CM

## 2021-08-26 DIAGNOSIS — I10 Essential (primary) hypertension: Secondary | ICD-10-CM | POA: Diagnosis not present

## 2021-08-26 DIAGNOSIS — N3001 Acute cystitis with hematuria: Secondary | ICD-10-CM | POA: Diagnosis not present

## 2021-08-26 LAB — POC URINALSYSI DIPSTICK (AUTOMATED)
Glucose, UA: NEGATIVE
Ketones, UA: POSITIVE
Nitrite, UA: POSITIVE
Protein, UA: POSITIVE — AB
Spec Grav, UA: >=1.03 — AB
Urobilinogen, UA: 0.2 U/dL
pH, UA: 6

## 2021-08-26 MED ORDER — NITROFURANTOIN MONOHYD MACRO 100 MG PO CAPS: 100.0000 mg | ORAL_CAPSULE | Freq: Two times a day (BID) | ORAL | 0 refills | Status: DC

## 2021-08-26 NOTE — Assessment & Plan Note (Signed)
BP Readings from Last 3 Encounters:  08/26/21 (!) 154/87  11/28/20 132/86  09/28/20 108/68   Repeat BP was better. States she forgot her BP med yesterday AM but did take it today in the AM. She is advised to check her bp at home and call us if she is consistently running >140/90. Continue  BP elevated today. Continue current dose of Uniretic.

## 2021-08-26 NOTE — Progress Notes (Signed)
Subjective:   By signing my name below, I, Rachel Oconnor, attest that this documentation has been prepared under the direction and in the presence of Rachel Oconnor. 08/26/2021    Patient ID: Rachel Oconnor, female    DOB: 10-01-1975, 45 y.o.   MRN: [id number]  Chief Complaint  Patient presents with   Dysuria    Patient complains of pain with urination    Dysuria  Associated symptoms include frequency and hematuria.  Patient is in today for a office visit.   Frequency/Pain- She complains of urinary frequency and pain while urinating. She denies having any fevers. She also has frequent headaches. She typically has back pain but it is not worse than her normal pain.  She found blood in her urine this morning.  Urine was "pink" in color.    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 Dellis Filbert requests two hours needs a bed for overnight stay   WISDOM  TOOTH EXTRACTION      Family History  Problem Relation Age of Onset   Hypertension Mother    Cancer Father    Hypertension Maternal Grandmother    Breast cancer Maternal Aunt    Diabetes Maternal Aunt    Breast cancer Paternal Aunt    Cancer Paternal Aunt        colon   Diabetes Paternal Aunt     Social History   Socioeconomic History   Marital status: Married    Spouse name: Not on file   Number of children: Not on file   Years of education: Not on file   Highest education level: Not on file  Occupational History   Not on file  Tobacco Use   Smoking status: Never   Smokeless tobacco: Never  Vaping Use   Vaping Use: Never used  Substance and Sexual Activity   Alcohol use: Yes    Comment: wine WEEKENDS   Drug use: No   Sexual activity: Yes    Partners: Male    Comment: 1st intercourse- 29, partners- 5  Other Topics Concern   Not on file  Social History Narrative   Not on file   Social Determinants of Health   Financial Resource Strain: Not on file  Food Insecurity: Not on file  Transportation Needs: Not on file  Physical Activity: Not on file  Stress:  Not on file  Social Connections: Not on file  Intimate Partner Violence: Not on file    Outpatient Medications Prior to Visit  Medication Sig Dispense Refill   levocetirizine (XYZAL) 5 MG tablet Take 1 tablet (5 mg total) by mouth every evening. 30 tablet 3   moexipril-hydrochlorothiazide (UNIRETIC) 7.5-12.5 MG tablet one tablet     montelukast (SINGULAIR) 10 MG tablet Take 1 tablet (10 mg total) by mouth at bedtime. 30 tablet 3   phentermine (ADIPEX-P) 37.5 MG tablet TAKE 1 TABLET BY MOUTH BEFORE BREAKFAST 30 tablet 0   phentermine (ADIPEX-P) 37.5 MG tablet Take 1 tablet (37.5 mg total) by mouth daily before breakfast. 30 tablet 0   phentermine (ADIPEX-P) 37.5 MG tablet Take 1 tablet (37.5 mg total) by mouth daily before breakfast. 30 tablet 0   sertraline (ZOLOFT) 50 MG tablet Take 1 tablet (50 mg total) by  mouth daily. 90 tablet 1   Turmeric POWD Take 1 Scoop by mouth 3 (three) times a week. Mix with hot tea and apple cider vinegar     No facility-administered medications prior to visit.    Allergies  Allergen Reactions   Amlodipine Besylate Swelling   Amoxicillin     Red blotches Did it involve swelling of the face/tongue/throat, SOB, or low BP? No Did it involve sudden or severe rash/hives, skin peeling, or any reaction on the inside of your mouth or nose? No Did you need to seek medical attention at a hospital or doctor's office? No When did it last happen?     20 years ago  If all above answers are "NO", may proceed with cephalosporin use.    Other     No blood products    Review of Systems  Constitutional:  Negative for fever.  Genitourinary:  Positive for dysuria, frequency and hematuria.  Musculoskeletal:  Positive for back pain.      Objective:    Physical Exam Constitutional:      General: She is not in acute distress.    Appearance: Normal appearance. She is not ill-appearing.  HENT:     Head: Normocephalic and atraumatic.     Right Ear: External ear normal.     Left Ear: External ear normal.  Eyes:     Extraocular Movements: Extraocular movements intact.     Pupils: Pupils are equal, round, and reactive to light.  Cardiovascular:     Rate and Rhythm: Normal rate and regular rhythm.     Heart sounds: Normal heart sounds. No murmur heard.   No gallop.  Pulmonary:     Effort: Pulmonary effort is normal. No respiratory distress.     Breath sounds: Normal breath sounds. No wheezing or rales.  Abdominal:     Palpations: Abdomen is soft.     Tenderness: There is no abdominal tenderness. There is no right CVA tenderness or left CVA tenderness.  Skin:    General: Skin is warm and dry.  Neurological:     Mental Status: She is alert and oriented to person, place, and time.  Psychiatric:        Behavior: Behavior normal.        Judgment: Judgment normal.    BP  (!) 144/88   Pulse 72   Temp 98.6 F (37 C) (Oral)   Resp 16   Ht 5\' 7"  (1.702 m)   Wt 184 lb (83.5 kg)   LMP 11/02/2018   SpO2 100%   BMI 28.82 kg/m  Wt Readings from  Last 3 Encounters:  08/26/21 184 lb (83.5 kg)  11/28/20 189 lb 3.2 oz (85.8 kg)  09/28/20 180 lb (81.6 kg)       Assessment & Plan:   Problem List Items Addressed This Visit       Unprioritized   Essential hypertension    BP Readings from Last 3 Encounters:  08/26/21 (!) 154/87  11/28/20 132/86  09/28/20 108/68  Repeat BP was better. States she forgot her BP med yesterday AM but did take it today in the AM. She is advised to check her bp at home and call us if she is consistently running >140/90. Continue  BP elevated today. Continue current dose of Uniretic.        Relevant Medications   moexipril-hydrochlorothiazide (UNIRETIC) 7.5-12.5 MG tablet   Acute cystitis with hematuria    UA suggestive of UTI. Will send for culture and begin macrobid 100mg  bid.       Other Visit Diagnoses     Dysuria    -  Primary   Relevant Orders   POCT Urinalysis Dipstick (Automated) (Completed)   Urine Culture        Meds ordered this encounter  Medications   nitrofurantoin, macrocrystal-monohydrate, (MACROBID) 100 MG capsule    Sig: Take 1 capsule (100 mg total) by mouth 2 (two) times daily.    Dispense:  10 capsule    Refill:  0    Order Specific Question:   Supervising Provider    Answer:   Penni Homans A [[id number]]    I, Rachel Oconnor, personally preformed the services described in this documentation.  All medical record entries made by the scribe were at my direction and in my presence.  I have reviewed the chart and discharge instructions (if applicable) and agree that the record reflects my personal performance and is accurate and complete. 08/26/2021   I,Rachel Oconnor,acting as a Education administrator for Nance Pear, Oconnor.,have documented all relevant documentation on the behalf of Nance Pear, Oconnor,as directed by  Nance Pear, Oconnor while in the presence of Nance Pear, Oconnor.   Nance Pear, Oconnor

## 2021-08-26 NOTE — Patient Instructions (Signed)
Please begin macrobid (antibiotic) twice daily.  Call if new/worsening symptoms or if symptoms do not improve.

## 2021-08-26 NOTE — Assessment & Plan Note (Signed)
UA suggestive of UTI. Will send for culture and begin macrobid 100mg  bid.

## 2021-08-28 LAB — URINE CULTURE
MICRO NUMBER:: 12714197
SPECIMEN QUALITY:: ADEQUATE

## 2021-10-01 ENCOUNTER — Ambulatory Visit: Payer: 59 | Admitting: Family Medicine

## 2021-10-01 ENCOUNTER — Encounter: Payer: Self-pay | Admitting: Family Medicine

## 2021-10-01 ENCOUNTER — Other Ambulatory Visit (HOSPITAL_BASED_OUTPATIENT_CLINIC_OR_DEPARTMENT_OTHER): Payer: Self-pay

## 2021-10-01 VITALS — BP 136/76 | HR 62 | Temp 99.0°F | Ht 67.0 in | Wt 187.0 lb

## 2021-10-01 DIAGNOSIS — R0683 Snoring: Secondary | ICD-10-CM

## 2021-10-01 MED ORDER — HYDROCHLOROTHIAZIDE 12.5 MG PO TABS
12.5000 mg | ORAL_TABLET | Freq: Every day | ORAL | 5 refills | Status: DC
  Filled 2021-10-01: qty 30, 30d supply, fill #0
  Filled 2022-04-01 – 2022-05-16 (×2): qty 30, 30d supply, fill #1
  Filled 2022-07-10: qty 30, 30d supply, fill #2
  Filled 2022-09-01: qty 30, 30d supply, fill #3

## 2021-10-01 NOTE — Patient Instructions (Addendum)
Keep the diet clean and stay active.  If you do not hear anything about your referral in the next 1-2 weeks, call our office and ask for an update.  Consider elevating the head of your bed to help with the nighttime issues.   Let us know if you need anything.

## 2021-10-01 NOTE — Progress Notes (Signed)
Chief Complaint  Patient presents with   Follow-up    Breathing problems at night/stopping breathing at night    Subjective: Patient is a 46 y.o. female here for f/u.  +famx of OSA. Over the past few weeks, she has been waking up gasping for air. Happening 3-4 times per week. She wakes up feeling refreshed. No AM HA's, she does snore. No witnessed apneic episodes. She does have daytime fatigue. She does not fall asleep while driving. 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 That dx was 8 yrs ago.   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 No accessory muscle use Psych: Age appropriate judgment and insight, normal affect and mood  Assessment and Plan: Snoring - Plan: Ambulatory referral to Pulmonology  Elevated HOB in meantime. Refer to sleep team for OSA eval which with her age and famhx, she may have developed.  F/u in 6 mo The patient voiced understanding and agreement to the plan.  [place], DO 10/01/21  4:55 PM

## 2021-11-12 ENCOUNTER — Other Ambulatory Visit: Payer: Self-pay

## 2021-11-12 ENCOUNTER — Encounter: Payer: Self-pay | Admitting: Pulmonary Disease

## 2021-11-12 ENCOUNTER — Ambulatory Visit (INDEPENDENT_AMBULATORY_CARE_PROVIDER_SITE_OTHER): Payer: 59 | Admitting: Pulmonary Disease

## 2021-11-12 VITALS — BP 118/76 | HR 93 | Temp 97.9°F | Ht 67.0 in | Wt 187.6 lb

## 2021-11-12 DIAGNOSIS — R0683 Snoring: Secondary | ICD-10-CM | POA: Diagnosis not present

## 2021-11-12 NOTE — Patient Instructions (Signed)
Will arrange for home sleep study Will call to arrange for follow up after sleep study reviewed  

## 2021-11-12 NOTE — Progress Notes (Signed)
[facility]  Chief Complaint  Patient presents with   Consult    She has noticed that she snores, wakes up gasping for air, and feels tired during the day x 3 months,     Past Surgical History:  She  has a past surgical history that includes davinci myomectomy (04/2011); Wisdom tooth extraction; Myomectomy; Ablation; Hernia repair; Robotic assisted laparoscopic hysterectomy and salpingectomy (Bilateral, 11/16/2018); and Abdominal hysterectomy.  Past Medical History:  Depression, HTN, Jehovah's witness, Allergies  Constitutional:  BP 118/76 (BP Location: Left Arm, Patient Position: Sitting, Cuff Size: Normal)    Pulse 93    Temp 97.9 F (36.6 C) (Oral)    Ht 5\' 7"  (1.702 m)    Wt 187 lb 9.6 oz (85.1 kg)    LMP 11/02/2018    SpO2 97%    BMI 29.38 kg/m   Brief Summary:  Rachel Oconnor is a 46 y.o. female with snoring.      Subjective:   She had sleep study in 2017 that didn't show sleep apnea.  Weight at the time was 189 lbs.  She was set up with an oral appliance for "sleep arrhythmia".  She uses this intermittently.  She still snores.  Her snoring has gotten worse over the years.  She wakes up feeling like she can't breath and her heart racing.  She feels anxious when she wakes up like this.  She is a restless sleeper.  She has trouble staying awake when reading.  Several of her family members have sleep apnea and use a CPAP.  She goes to sleep at 1030 pm.  She falls asleep quickly.  She wakes up some times to use the bathroom.  She gets out of bed between 7 and 730 am.  She feels tired in the morning.  She denies morning headache.  She drinks coffee and green coffee bean extract to help stay awake.  She uses melatonin intermittently at night, and sometimes drinks alcohol to help sleep.  She occasionally gets leg cramps at night.  She denies sleep walking, sleep talking, bruxism, or nightmares.  There is no history of restless legs.  She denies  sleep hallucinations, sleep paralysis, or cataplexy.  The Epworth score is 10 out of 24.   Physical Exam:   Appearance - well kempt   ENMT - no sinus tenderness, no oral exudate, no LAN, Mallampati 3 airway, no stridor, scalloped tongue, 2+ tonsils  Respiratory - equal breath sounds bilaterally, no wheezing or rales  CV - s1s2 regular rate and rhythm, no murmurs  Ext - no clubbing, no edema  Skin - no rashes  Psych - normal mood and affect   Sleep Tests:  HST 03/24/16 >> AHI 0.5, SpO2 low 88%  Cardiac Tests:  Echo 01/27/18 >> EF 55 to 60%  Social History:  She  reports that she has never smoked. She has never used smokeless tobacco. She reports current alcohol use. She reports that she does not use drugs.  Family History:  Her family history includes Breast cancer in her maternal aunt and paternal aunt; Cancer in her father and paternal aunt; Diabetes in her maternal aunt and paternal aunt; Hypertension in her maternal grandmother and mother.    Discussion:  She has snoring, sleep disruption, apnea, and daytime sleepiness.  She has history of depression and hypertension.  She was previously fitted with an oral appliance for snoring, but still has trouble with her sleep.  I am  concerned she could have developed sleep apnea.  Assessment/Plan:   Snoring with excessive daytime sleepiness. - will need to arrange for a home sleep study - explained how drinking alcohol close to bed time can cause sleep disruption and worsening apnea, and advised her not to drink alcohol too close to her bedtime  Obesity. - discussed how weight can impact sleep and risk for sleep disordered breathing - discussed options to assist with weight loss: combination of diet modification, cardiovascular and strength training exercises  Cardiovascular risk. - had an extensive discussion regarding the adverse health consequences related to untreated sleep disordered breathing - specifically discussed the  risks for hypertension, coronary artery disease, cardiac dysrhythmias, cerebrovascular disease, and diabetes - lifestyle modification discussed  Safe driving practices. - discussed how sleep disruption can increase risk of accidents, particularly when driving - safe driving practices were discussed  Therapies for obstructive sleep apnea. - if the sleep study shows significant sleep apnea, then various therapies for treatment were reviewed: CPAP, oral appliance, and surgical interventions   Time Spent Involved in Patient Care on Day of Examination:  36 minutes  Follow up:   Patient Instructions  Will arrange for home sleep study Will call to arrange for follow up after sleep study reviewed   Medication List:   Allergies as of 11/12/2021       Reactions   Amlodipine Besylate Swelling   Amoxicillin Rash   Red blotches Did it involve swelling of the face/tongue/throat, SOB, or low BP? No Did it involve sudden or severe rash/hives, skin peeling, or any reaction on the inside of your mouth or nose? No Did you need to seek medical attention at a hospital or doctor's office? No When did it last happen?     20 years ago  If all above answers are "NO", may proceed with cephalosporin use.   Other Other (See Comments)   No blood products        Medication List        Accurate as of November 12, 2021 10:09 AM. If you have any questions, ask your nurse or doctor.          hydrochlorothiazide 12.5 MG tablet Commonly known as: HYDRODIURIL Take 1 tablet (12.5 mg total) by mouth daily.   levocetirizine 5 MG tablet Commonly known as: XYZAL Take 1 tablet (5 mg total) by mouth every evening.   montelukast 10 MG tablet Commonly known as: SINGULAIR Take 1 tablet (10 mg total) by mouth at bedtime.   sertraline 50 MG tablet Commonly known as: ZOLOFT Take 1 tablet (50 mg total) by mouth daily.   Turmeric Powd Take 1 Scoop by mouth 3 (three) times a week. Mix with hot tea and  apple cider vinegar        Signature:  Chesley Mires, MD [facility] Pager - [phone] 11/12/2021, 10:09 AM

## 2021-11-25 ENCOUNTER — Other Ambulatory Visit: Payer: Self-pay

## 2021-11-25 ENCOUNTER — Ambulatory Visit
Admission: RE | Admit: 2021-11-25 | Discharge: 2021-11-25 | Disposition: A | Discharge status: Home / Self Care | Payer: 59 | Source: Ambulatory Visit | Attending: Family Medicine | Admitting: Family Medicine

## 2021-11-25 DIAGNOSIS — R928 Other abnormal and inconclusive findings on diagnostic imaging of breast: Secondary | ICD-10-CM

## 2021-11-27 ENCOUNTER — Other Ambulatory Visit (HOSPITAL_BASED_OUTPATIENT_CLINIC_OR_DEPARTMENT_OTHER): Payer: Self-pay

## 2021-11-27 ENCOUNTER — Ambulatory Visit: Payer: 59 | Admitting: Family Medicine

## 2021-11-27 ENCOUNTER — Encounter: Payer: Self-pay | Admitting: Family Medicine

## 2021-11-27 VITALS — BP 131/85 | HR 71 | Temp 98.3°F | Ht 67.0 in | Wt 183.0 lb

## 2021-11-27 DIAGNOSIS — R197 Diarrhea, unspecified: Secondary | ICD-10-CM | POA: Diagnosis not present

## 2021-11-27 DIAGNOSIS — E663 Overweight: Secondary | ICD-10-CM | POA: Diagnosis not present

## 2021-11-27 MED ORDER — PHENTERMINE HCL 37.5 MG PO TABS
37.5000 mg | ORAL_TABLET | Freq: Every day | ORAL | 0 refills | Status: DC
  Filled 2021-11-27: qty 30, 30d supply, fill #0

## 2021-11-27 NOTE — Patient Instructions (Signed)
Stay hydrated. Continue to drink electrolytes while having diarrhea. ? ?If you aren't better by tomorrow, go through with the stool culture. ? ?Let us know if you need anything. ?

## 2021-11-27 NOTE — Progress Notes (Signed)
Chief Complaint  ?Patient presents with  ? Diarrhea  ?  Since Saturday ?Feeling weak now.  ? ? ? ?Subjective ?Rachel Oconnor is a 46 y.o. female who presents with vomiting and diarrhea ?Symptoms began 4 d ago.  ?Patient has diarrhea and chills ?Patient denies abdominal pain, vomiting, fever, sweats, and URI symptoms ?No antibiotic use, recent travel, new foods.  ?Treatment to date: electrolyte treatment, Pepto ?Sick contacts: none known ? ?Patient is starting tomorrow.  She has a full issue at the end of the month and is requesting a refill of Adipex.  She has been on this in the past which did help him.  Last time being on it was around a year ago.  She is lost around 25 pounds since the start of her journey.  Diet is fair, she tries to stay active. ? ?Past Medical History:  ?Diagnosis Date  ? Allergy   ? Depression   ? Fibroids 2013  ? Hypertension   ? no meds..pt states up last year  ? Refusal of blood transfusions as patient is Jehovah's Witness 2013  ? Sleep disorder breathing   ? SLEEP ARRYTHMIA WEARDS MOUTH GUARD  ? ? ?Exam ?BP 131/85   Pulse 71   Temp 98.3 ?F (36.8 ?C) (Oral)   Ht '5\' 7"'$  (1.702 m)   Wt 183 lb (83 kg)   LMP 11/02/2018   SpO2 98%   BMI 28.66 kg/m?  ?General:  well developed, well hydrated, in no apparent distress ?Skin:  warm, no pallor or diaphoresis, no rashes ?Throat/Pharynx:  lips and gingiva without lesion; tongue and uvula midline; non-inflamed pharynx; no exudates or postnasal drainage ?Lungs:  clear to auscultation, breath sounds equal bilaterally, no respiratory distress, no wheezes ?Cardio:  RRR ?Abdomen:  abdomen soft, nontender; bowel sounds normal; no masses or organomegaly ?Psych: Appropriate judgement/insight ? ?Assessment and Plan ? ?Diarrhea, unspecified type - Plan: GI Profile, Stool, PCR ? ?GAD (generalized anxiety disorder) - Plan: phentermine (ADIPEX-P) 37.5 MG tablet ? ?New problem with uncertain prognosis at this time.  Check stool culture if no improvement by  tomorrow.  That was marked for 5-day point.  She does not wish to try anything empirically without seeing the results of the stool culture.  Stay hydrated with electrolyte replacement.  Avoid aggravating foods, discussed advancing diet. ?Chronic, not currently controlled.  Counseled on diet and exercise.  Refill 1 month of Adipex.  May not be covered if she is no longer in the obese category. ?The patient voiced understanding and agreement to the plan. ? ?Shelda Pal, DO ?11/27/21  ?4:31 PM ? ?

## 2021-12-27 ENCOUNTER — Ambulatory Visit: Payer: 59

## 2021-12-27 DIAGNOSIS — G4733 Obstructive sleep apnea (adult) (pediatric): Secondary | ICD-10-CM | POA: Diagnosis not present

## 2021-12-27 DIAGNOSIS — R0683 Snoring: Secondary | ICD-10-CM

## 2022-01-06 ENCOUNTER — Telehealth: Payer: Self-pay | Admitting: Pulmonary Disease

## 2022-01-06 DIAGNOSIS — G4733 Obstructive sleep apnea (adult) (pediatric): Secondary | ICD-10-CM | POA: Diagnosis not present

## 2022-01-06 NOTE — Telephone Encounter (Signed)
ATC patient.  LMTCB. 

## 2022-01-06 NOTE — Telephone Encounter (Signed)
HST 12/29/21 >> AHI 6.3, SpO2 low 83% ? ? ?Please inform her that her sleep study shows mild obstructive sleep apnea.  Please arrange for ROV with me or NP to discuss treatment options. ? ? ? ?

## 2022-01-08 NOTE — Telephone Encounter (Signed)
Called and went over results with patient. She voiced understanding. Scheduled her for next Monday 4/24 in [place] office with Roxan Diesel NP to go over results. Nothing further needed at this time ?

## 2022-01-13 ENCOUNTER — Encounter: Payer: Self-pay | Admitting: Nurse Practitioner

## 2022-01-13 ENCOUNTER — Ambulatory Visit: Payer: 59 | Admitting: Nurse Practitioner

## 2022-01-13 DIAGNOSIS — G4733 Obstructive sleep apnea (adult) (pediatric): Secondary | ICD-10-CM | POA: Diagnosis not present

## 2022-01-13 NOTE — Patient Instructions (Signed)
You have mild obstructive sleep apnea. Advise side lying sleeping position or elevating head of bed 30 degrees.  ?You can restart use of your oral appliance - please let us know the orthodontist who you originally went to and we can send a referral back to them if needed.  ? ?Follow up in 1 year with Dr. Halford Chessman. If symptoms do not improve or worsen, please contact office for sooner follow up or seek emergency care. ?

## 2022-01-13 NOTE — Progress Notes (Signed)
? ?'@Patient'$  ID: Rachel Oconnor, female    DOB: Sep 17, 1976, 46 y.o.   MRN: [id number] ? ?Chief Complaint  ?Patient presents with  ? Follow-up  ?  She is here to go over HST results.   ? ? ?Referring provider: ?Shelda Pal* ? ?HPI: ?46 year old female, never smoker followed for mild OSA. She is a patient of Dr. Juanetta Gosling and last seen in office 11/12/2021. Past medical history significant for HTN, seasonal allergic rhinitis, GAD. ? ?TEST/EVENTS:  ?12/29/2021 HST: AHI 6.3, SpO2 low 83%, average 94% ? ?11/12/2021: OV with Dr. Halford Chessman for sleep consult. Snores at night, worsening over the years. Gets 8-9 hours of sleep a night but wakes feeling tired. Did have a sleep study in the past without sleep apnea but was prescribed oral appliance for sleep arrhythmia. HST ordered for further evaluation.  ? ?01/13/2022: Today - follow up ?Patient presents today for follow up after home sleep study that showed mild OSA. She continues to have daytime sleepiness and snoring at night. No witness apneas. She continues to get around 8-9 hours of sleep a night. No morning headaches or drowsy driving. She does have an oral appliance at home but feels like it may need to be adjusted.  ? ?Allergies  ?Allergen Reactions  ? Amlodipine Besylate Swelling  ? Amoxicillin Rash  ?  Red blotches ?Did it involve swelling of the face/tongue/throat, SOB, or low BP? No ?Did it involve sudden or severe rash/hives, skin peeling, or any reaction on the inside of your mouth or nose? No ?Did you need to seek medical attention at a hospital or doctor's office? No ?When did it last happen?     20 years ago  ?If all above answers are "NO", may proceed with cephalosporin use. ?  ? Other Other (See Comments)  ?  No blood products  ? ? ?Immunization History  ?Administered Date(s) Administered  ? Moderna Sars-Covid-2 Vaccination 11/24/2019, 12/23/2019, 09/17/2020  ? Td 08/28/2003  ? Tdap 08/27/2010, 08/01/2012, 12/04/2016, 04/05/2021  ? ? ?Past Medical History:   ?Diagnosis Date  ? Allergy   ? Depression   ? Fibroids 2013  ? Hypertension   ? no meds..pt states up last year  ? Refusal of blood transfusions as patient is Jehovah's Witness 2013  ? Sleep disorder breathing   ? SLEEP ARRYTHMIA WEARDS MOUTH GUARD  ? ? ?Tobacco History: ?Social History  ? ?Tobacco Use  ?Smoking Status Never  ?Smokeless Tobacco Never  ? ?Counseling given: Not Answered ? ? ?Outpatient Medications Prior to Visit  ?Medication Sig Dispense Refill  ? hydrochlorothiazide (HYDRODIURIL) 12.5 MG tablet Take 1 tablet (12.5 mg total) by mouth daily. 30 tablet 5  ? levocetirizine (XYZAL) 5 MG tablet Take 1 tablet (5 mg total) by mouth every evening. 30 tablet 3  ? montelukast (SINGULAIR) 10 MG tablet Take 1 tablet (10 mg total) by mouth at bedtime. 30 tablet 3  ? phentermine (ADIPEX-P) 37.5 MG tablet Take 1 tablet (37.5 mg total) by mouth daily before breakfast. 30 tablet 0  ? sertraline (ZOLOFT) 50 MG tablet Take 1 tablet (50 mg total) by mouth daily. 90 tablet 1  ? Turmeric POWD Take 1 Scoop by mouth 3 (three) times a week. Mix with hot tea and apple cider vinegar    ? ?No facility-administered medications prior to visit.  ? ? ? ?Review of Systems:  ? ?Constitutional: No weight loss or gain, night sweats, fevers, chills. +daytime fatigue ?HEENT: No headaches, difficulty swallowing, tooth/dental  problems, or sore throat. No sneezing, itching, ear ache, nasal congestion, or post nasal drip. +snoring ?CV:  No chest pain, orthopnea, PND, swelling in lower extremities, anasarca, dizziness, palpitations, syncope ?Resp: No shortness of breath with exertion or at rest. No excess mucus or change in color of mucus. No productive or non-productive. No hemoptysis. No wheezing.  No chest wall deformity ?Skin: No rash, lesions, ulcerations ?MSK:  No joint pain or swelling.  No decreased range of motion.  No back pain. ?Neuro: No dizziness or lightheadedness.  ?Psych: No depression or anxiety. Mood stable.  ? ? ? ?Physical  Exam: ? ?BP 112/68 (BP Location: Left Arm, Cuff Size: Normal)   Pulse 78   Temp 98.3 ?F (36.8 ?C) (Oral)   Ht '5\' 7"'$  (1.702 m)   Wt 191 lb (86.6 kg)   LMP 11/02/2018   SpO2 98%   BMI 29.91 kg/m?  ? ?GEN: Pleasant, interactive, well-appearing; in no acute distress. ?HEENT:  Normocephalic and atraumatic.  PERRLA. Sclera white. Nasal turbinates pink, moist and patent bilaterally. No rhinorrhea present. Oropharynx pink and moist, without exudate or edema. No lesions, ulcerations, or postnasal drip.  ?NECK:  Supple w/ fair ROM. ?CV: RRR, no m/r/g, no peripheral edema. Pulses intact, +2 bilaterally. No cyanosis, pallor or clubbing. ?PULMONARY:  Unlabored, regular breathing. Clear bilaterally A&P w/o wheezes/rales/rhonchi. No accessory muscle use. No dullness to percussion. ?GI: BS present and normoactive. Soft, non-tender to palpation.  ?Neuro: A/Ox3. No focal deficits noted.   ?Skin: Warm, no lesions or rashe ?Psych: Normal affect and behavior. Judgement and thought content appropriate.  ? ? ? ?Lab Results: ? ?CBC ?   ?Component Value Date/Time  ? WBC 5.5 08/23/2019 1532  ? RBC 4.51 08/23/2019 1532  ? HGB 13.4 08/23/2019 1532  ? HCT 42.1 08/23/2019 1532  ? PLT 419 (H) 08/23/2019 1532  ? MCV 93.3 08/23/2019 1532  ? MCH 29.7 08/23/2019 1532  ? MCHC 31.8 08/23/2019 1532  ? RDW 14.7 08/23/2019 1532  ? LYMPHSABS 2.0 08/23/2019 1532  ? MONOABS 0.5 08/23/2019 1532  ? EOSABS 0.2 08/23/2019 1532  ? BASOSABS 0.0 08/23/2019 1532  ? ? ?BMET ?   ?Component Value Date/Time  ? NA 138 08/23/2019 1532  ? K 3.9 08/23/2019 1532  ? CL 105 08/23/2019 1532  ? CO2 25 08/23/2019 1532  ? GLUCOSE 94 08/23/2019 1532  ? BUN 15 08/23/2019 1532  ? CREATININE 0.79 08/23/2019 1532  ? CALCIUM 9.6 08/23/2019 1532  ? GFRNONAA >60 08/23/2019 1532  ? GFRAA >60 08/23/2019 1532  ? ? ?BNP ?No results found for: BNP ? ? ?Imaging: ? ?No results found. ? ? ? ?   ? View : No data to display.  ?  ?  ?  ? ? ?No results found for:  NITRICOXIDE ? ? ? ? ? ?Assessment & Plan:  ? ?Sleep apnea ?Mild with AHI 6.3. We discussed how untreated sleep apnea puts an individual at risk for cardiac arrhthymias, pulm HTN, DM, stroke and increases their risk for daytime accidents; although, these are minimal given the mild severity of her OSA. We also briefly reviewed treatment options including weight loss, side sleeping position, oral appliance, CPAP therapy or referral to ENT for possible surgical options. Pt recently got a bed that she can adjust the head of. She would prefer to sleep with elevated HOB and use oral appliance for therapy at this point. Advised her to notify if she needs a new referral for the orthodontist who she saw  last.  ? ?Patient Instructions  ?You have mild obstructive sleep apnea. Advise side lying sleeping position or elevating head of bed 30 degrees.  ?You can restart use of your oral appliance - please let us know the orthodontist who you originally went to and we can send a referral back to them if needed.  ? ?Follow up in 1 year with Dr. Halford Chessman. If symptoms do not improve or worsen, please contact office for sooner follow up or seek emergency care. ? ? ? ?I spent 25 minutes of dedicated to the care of this patient on the date of this encounter to include pre-visit review of records, face-to-face time with the patient discussing conditions above, post visit ordering of testing, clinical documentation with the electronic health record, making appropriate referrals as documented, and communicating necessary findings to members of the patients care team. ? ?Clayton Bibles, NP ?01/13/2022 ? ?Pt aware and understands NP's role.  ? ?

## 2022-01-13 NOTE — Assessment & Plan Note (Signed)
Mild with AHI 6.3. We discussed how untreated sleep apnea puts an individual at risk for cardiac arrhthymias, pulm HTN, DM, stroke and increases their risk for daytime accidents; although, these are minimal given the mild severity of her OSA. We also briefly reviewed treatment options including weight loss, side sleeping position, oral appliance, CPAP therapy or referral to ENT for possible surgical options. Pt recently got a bed that she can adjust the head of. She would prefer to sleep with elevated HOB and use oral appliance for therapy at this point. Advised her to notify if she needs a new referral for the orthodontist who she saw last.  ? ?Patient Instructions  ?You have mild obstructive sleep apnea. Advise side lying sleeping position or elevating head of bed 30 degrees.  ?You can restart use of your oral appliance - please let us know the orthodontist who you originally went to and we can send a referral back to them if needed.  ? ?Follow up in 1 year with Dr. Halford Chessman. If symptoms do not improve or worsen, please contact office for sooner follow up or seek emergency care. ? ? ?

## 2022-01-14 NOTE — Progress Notes (Signed)
Reviewed and agree with assessment/plan. ? ? ?Chesley Mires, MD ?[place] ?01/14/2022, 8:25 AM ?Pager:  [phone] ? ?

## 2022-03-31 ENCOUNTER — Ambulatory Visit (INDEPENDENT_AMBULATORY_CARE_PROVIDER_SITE_OTHER): Payer: 59 | Admitting: Family Medicine

## 2022-03-31 ENCOUNTER — Encounter: Payer: Self-pay | Admitting: Family Medicine

## 2022-03-31 ENCOUNTER — Telehealth: Payer: Self-pay | Admitting: Family Medicine

## 2022-03-31 VITALS — BP 138/80 | HR 66 | Temp 98.2°F | Ht 67.0 in | Wt 195.4 lb

## 2022-03-31 DIAGNOSIS — Z0000 Encounter for general adult medical examination without abnormal findings: Secondary | ICD-10-CM

## 2022-03-31 DIAGNOSIS — Z1159 Encounter for screening for other viral diseases: Secondary | ICD-10-CM | POA: Diagnosis not present

## 2022-03-31 DIAGNOSIS — E669 Obesity, unspecified: Secondary | ICD-10-CM

## 2022-03-31 DIAGNOSIS — Z0001 Encounter for general adult medical examination with abnormal findings: Secondary | ICD-10-CM | POA: Diagnosis not present

## 2022-03-31 LAB — LIPID PANEL
Cholesterol: 181 mg/dL (ref 0–200)
HDL: 60.6 mg/dL
LDL Cholesterol: 102 mg/dL — ABNORMAL HIGH (ref 0–99)
NonHDL: 120.87
Total CHOL/HDL Ratio: 3
Triglycerides: 95 mg/dL (ref 0.0–149.0)
VLDL: 19 mg/dL (ref 0.0–40.0)

## 2022-03-31 LAB — COMPREHENSIVE METABOLIC PANEL
ALT: 9 U/L (ref 0–35)
AST: 14 U/L (ref 0–37)
Albumin: 4.5 g/dL (ref 3.5–5.2)
Alkaline Phosphatase: 46 U/L (ref 39–117)
BUN: 13 mg/dL (ref 6–23)
CO2: 27 mEq/L (ref 19–32)
Calcium: 10.1 mg/dL (ref 8.4–10.5)
Chloride: 102 mEq/L (ref 96–112)
Creatinine, Ser: 0.83 mg/dL (ref 0.40–1.20)
GFR: 84.64 mL/min (ref >60.00)
Glucose, Bld: 79 mg/dL (ref 70–99)
Potassium: 5 mEq/L (ref 3.5–5.1)
Sodium: 136 mEq/L (ref 135–145)
Total Bilirubin: 0.5 mg/dL (ref 0.2–1.2)
Total Protein: 7 g/dL (ref 6.0–8.3)

## 2022-03-31 LAB — CBC
HCT: 37.6 % (ref 36.0–46.0)
Hemoglobin: 12.5 g/dL (ref 12.0–15.0)
MCHC: 33.1 g/dL (ref 30.0–36.0)
MCV: 93.6 fl (ref 78.0–100.0)
Platelets: 411 K/uL — ABNORMAL HIGH (ref 150.0–400.0)
RBC: 4.02 Mil/uL (ref 3.87–5.11)
RDW: 14.6 % (ref 11.5–15.5)
WBC: 5.1 K/uL (ref 4.0–10.5)

## 2022-03-31 MED ORDER — SEMAGLUTIDE-WEIGHT MANAGEMENT 1 MG/0.5ML ~~LOC~~ SOAJ: 1.0000 mg | SUBCUTANEOUS | 0 refills | Status: DC

## 2022-03-31 MED ORDER — SEMAGLUTIDE-WEIGHT MANAGEMENT 2.4 MG/0.75ML ~~LOC~~ SOAJ: 2.4000 mg | SUBCUTANEOUS | 0 refills | Status: DC

## 2022-03-31 MED ORDER — FLUTICASONE PROPIONATE 50 MCG/ACT NA SUSP: 2.0000 | Freq: Every day | NASAL | 5 refills | Status: DC

## 2022-03-31 MED ORDER — SEMAGLUTIDE-WEIGHT MANAGEMENT 0.5 MG/0.5ML ~~LOC~~ SOAJ: 0.5000 mg | SUBCUTANEOUS | 0 refills | Status: DC

## 2022-03-31 MED ORDER — SEMAGLUTIDE-WEIGHT MANAGEMENT 1.7 MG/0.75ML ~~LOC~~ SOAJ: 1.7000 mg | SUBCUTANEOUS | 0 refills | Status: DC

## 2022-03-31 MED ORDER — SEMAGLUTIDE-WEIGHT MANAGEMENT 0.25 MG/0.5ML ~~LOC~~ SOAJ: 0.2500 mg | SUBCUTANEOUS | 0 refills | Status: DC

## 2022-03-31 NOTE — Patient Instructions (Signed)
Give us 2-3 business days to get the results of your labs back.   Keep the diet clean and stay active.  Please get me a copy of your advanced directive form at your convenience.   Let us know if you need anything.  

## 2022-03-31 NOTE — Telephone Encounter (Signed)
Received PA approval. Patient/pharmacy informed

## 2022-03-31 NOTE — Telephone Encounter (Signed)
Initiated PA for EADGNP KEY:   [id number] Waiting response

## 2022-03-31 NOTE — Progress Notes (Signed)
Chief Complaint  Patient presents with   Annual Exam     Well Woman Rachel Oconnor is here for a complete physical.   Her last physical was >1 year ago.  Current diet: in general, a "healthy" diet. Current exercise: walking, wt resistance exercise. Weight is up a few lbs and she denies fatigue out of ordinary. Seatbelt? Yes Advanced directive? Not the living will  Health Maintenance Mammogram- Yes CCS- yes Tetanus- Yes Hep C screening- No HIV screening- Yes  Obesity Is gaining weight. Diet/exercise as above. Was using intermittent use of Adipex. She has been trying to lose weight around 3 years, worsening over the past 8 months. She has never been on another medication or seen a specialist.   Past Medical History:  Diagnosis Date   Allergy    Depression    Fibroids 2013   Hypertension    no meds..pt states up last year   Refusal of blood transfusions as patient is Jehovah's Witness 2013   Sleep disorder breathing    SLEEP ARRYTHMIA WEARDS MOUTH GUARD     Past Surgical History:  Procedure Laterality Date   ABLATION     UTERUS   davinci myomectomy  04/2011   HERNIA REPAIR     INGUINAL   MYOMECTOMY     ROBOTIC ASSISTED LAPAROSCOPIC HYSTERECTOMY AND SALPINGECTOMY Bilateral 11/16/2018   Procedure: XI ROBOTIC ASSISTED TOTAL LAPAROSCOPIC HYSTERECTOMY AND SALPINGECTOMY;  Surgeon: Princess Bruins, MD;  Location: [facility];  Service: Gynecology;  Laterality: Bilateral;  request to follow first case in block [facility]yn/Dr. Dellis Filbert requests two hours needs a bed for overnight stay   WISDOM TOOTH EXTRACTION      Medications  Current Outpatient Medications on File Prior to Visit  Medication Sig Dispense Refill   hydrochlorothiazide (HYDRODIURIL) 12.5 MG tablet Take 1 tablet (12.5 mg total) by mouth daily. 30 tablet 5   levocetirizine (XYZAL) 5 MG tablet Take 1 tablet (5 mg total) by mouth every evening. 30 tablet 3   montelukast (SINGULAIR) 10 MG tablet  Take 1 tablet (10 mg total) by mouth at bedtime. 30 tablet 3   sertraline (ZOLOFT) 50 MG tablet Take 1 tablet (50 mg total) by mouth daily. 90 tablet 1   Turmeric POWD Take 1 Scoop by mouth 3 (three) times a week. Mix with hot tea and apple cider vinegar     Allergies Allergies  Allergen Reactions   Amlodipine Besylate Swelling   Amoxicillin Rash    Red blotches Did it involve swelling of the face/tongue/throat, SOB, or low BP? No Did it involve sudden or severe rash/hives, skin peeling, or any reaction on the inside of your mouth or nose? No Did you need to seek medical attention at a hospital or doctor's office? No When did it last happen?     20 years ago  If all above answers are "NO", may proceed with cephalosporin use.    Other Other (See Comments)    No blood products    Review of Systems: Constitutional:  no unexpected weight changes Eye:  no recent significant change in vision Ear/Nose/Mouth/Throat:  Ears:  no recent change in hearing Nose/Mouth/Throat:  no complaints of nasal congestion, no sore throat Cardiovascular: no chest pain Respiratory:  no shortness of breath Gastrointestinal:  no abdominal pain, no change in bowel habits GU:  Female: negative for dysuria or pelvic pain Musculoskeletal/Extremities:  no pain of the joints Integumentary (Skin/Breast):  no abnormal skin lesions reported Neurologic:  no headaches  Endocrine:  denies fatigue Hematologic/Lymphatic:  No areas of easy bleeding  Exam BP 138/80   Pulse 66   Temp 98.2 F (36.8 C) (Oral)   Ht '5\' 7"'$  (1.702 m)   Wt 195 lb 6 oz (88.6 kg)   LMP 11/02/2018   SpO2 99%   BMI 30.60 kg/m  General:  well developed, well nourished, in no apparent distress Skin:  no significant moles, warts, or growths Head:  no masses, lesions, or tenderness Eyes:  pupils equal and round, sclera anicteric without injection Ears:  canals without lesions, TMs shiny without retraction, no obvious effusion, no erythema Nose:   nares patent, septum midline, mucosa normal, and no drainage or sinus tenderness Throat/Pharynx:  lips and gingiva without lesion; tongue and uvula midline; non-inflamed pharynx; no exudates or postnasal drainage Neck: neck supple without adenopathy, thyromegaly, or masses Lungs:  clear to auscultation, breath sounds equal bilaterally, no respiratory distress Cardio:  regular rate and rhythm, no LE edema Abdomen:  abdomen soft, nontender; bowel sounds normal; no masses or organomegaly Genital: Defer to GYN Musculoskeletal:  symmetrical muscle groups noted without atrophy or deformity Extremities:  no clubbing, cyanosis, or edema, no deformities, no skin discoloration Neuro:  gait normal; deep tendon reflexes normal and symmetric Psych: well oriented with normal range of affect and appropriate judgment/insight  Assessment and Plan  Well adult exam - Plan: CBC, Comprehensive metabolic panel, Lipid panel  Encounter for hepatitis C screening test for low risk patient - Plan: Hepatitis C antibody  Obesity (BMI 30-39.9) - Plan: Semaglutide-Weight Management 0.25 MG/0.5ML SOAJ, Semaglutide-Weight Management 0.5 MG/0.5ML SOAJ, Semaglutide-Weight Management 1 MG/0.5ML SOAJ, Semaglutide-Weight Management 1.7 MG/0.75ML SOAJ, Semaglutide-Weight Management 2.4 MG/0.75ML SOAJ   Well 46 y.o. female. Counseled on diet and exercise. Other orders as above. Advanced directive form provided today.  Obesity: Chronic, uncontrolled. Start [employer]. Will monitor weight loss progress. Discussed AE's to look out for.  Follow up in 6 mo or prn. The patient voiced understanding and agreement to the plan.  [place], DO 03/31/22 8:52 AM

## 2022-04-01 LAB — HEPATITIS C ANTIBODY: Hepatitis C Ab: NONREACTIVE

## 2022-04-01 NOTE — Telephone Encounter (Signed)
Approved from 03/31/2022 to 11/01/2022.

## 2022-04-02 ENCOUNTER — Other Ambulatory Visit (HOSPITAL_BASED_OUTPATIENT_CLINIC_OR_DEPARTMENT_OTHER): Payer: Self-pay

## 2022-04-04 ENCOUNTER — Telehealth: Payer: Self-pay | Admitting: *Deleted

## 2022-04-04 ENCOUNTER — Other Ambulatory Visit: Payer: Self-pay | Admitting: Family Medicine

## 2022-04-04 ENCOUNTER — Telehealth: Payer: Self-pay

## 2022-04-04 ENCOUNTER — Other Ambulatory Visit (HOSPITAL_BASED_OUTPATIENT_CLINIC_OR_DEPARTMENT_OTHER): Payer: Self-pay

## 2022-04-04 ENCOUNTER — Other Ambulatory Visit (HOSPITAL_COMMUNITY): Payer: Self-pay

## 2022-04-04 DIAGNOSIS — E669 Obesity, unspecified: Secondary | ICD-10-CM

## 2022-04-04 MED ORDER — SEMAGLUTIDE-WEIGHT MANAGEMENT 2.4 MG/0.75ML ~~LOC~~ SOAJ
2.4000 mg | SUBCUTANEOUS | 0 refills | Status: AC
  Filled 2022-04-04 – 2022-08-15 (×2): qty 3, 28d supply, fill #0

## 2022-04-04 MED ORDER — SEMAGLUTIDE-WEIGHT MANAGEMENT 1 MG/0.5ML ~~LOC~~ SOAJ
1.0000 mg | SUBCUTANEOUS | 0 refills | Status: DC
  Filled 2022-04-04 – 2022-06-10 (×2): qty 2, 28d supply, fill #0

## 2022-04-04 MED ORDER — SEMAGLUTIDE-WEIGHT MANAGEMENT 0.5 MG/0.5ML ~~LOC~~ SOAJ
0.5000 mg | SUBCUTANEOUS | 0 refills | Status: AC
  Filled 2022-04-04 – 2022-05-02 (×2): qty 2, 28d supply, fill #0

## 2022-04-04 MED ORDER — SEMAGLUTIDE-WEIGHT MANAGEMENT 0.25 MG/0.5ML ~~LOC~~ SOAJ: 0.2500 mg | SUBCUTANEOUS | 0 refills | Status: AC

## 2022-04-04 MED ORDER — SEMAGLUTIDE-WEIGHT MANAGEMENT 0.25 MG/0.5ML ~~LOC~~ SOAJ
0.2500 mg | SUBCUTANEOUS | 0 refills | Status: AC
  Filled 2022-04-04: qty 2, 28d supply, fill #0

## 2022-04-04 MED ORDER — SEMAGLUTIDE-WEIGHT MANAGEMENT 1.7 MG/0.75ML ~~LOC~~ SOAJ
1.7000 mg | SUBCUTANEOUS | 0 refills | Status: AC
  Filled 2022-04-04 – 2022-07-17 (×2): qty 3, 28d supply, fill #0

## 2022-04-04 NOTE — Telephone Encounter (Signed)
Patient was sent a my chart  message regarding her medication

## 2022-04-04 NOTE — Telephone Encounter (Signed)
Error

## 2022-04-04 NOTE — Telephone Encounter (Signed)
Who Is Calling Patient / Member / Family / Caregiver Caller Name [place] Phone Number [phone] Patient Name Rachel Oconnor Patient DOB [birth year] Call Type Message Only Information Provided Reason for Call Medication Question / Request Initial Comment Caller states she needs her prescription for Wegovy to be sent out to pharmacy. Disp. Time Disposition Final User 04/04/2022 7:37:35 AM General Information Provided Yes Candise Bowens

## 2022-04-09 ENCOUNTER — Encounter: Payer: Self-pay | Admitting: Family Medicine

## 2022-04-14 ENCOUNTER — Other Ambulatory Visit (HOSPITAL_BASED_OUTPATIENT_CLINIC_OR_DEPARTMENT_OTHER): Payer: Self-pay

## 2022-05-02 ENCOUNTER — Other Ambulatory Visit (HOSPITAL_BASED_OUTPATIENT_CLINIC_OR_DEPARTMENT_OTHER): Payer: Self-pay

## 2022-05-02 ENCOUNTER — Other Ambulatory Visit (HOSPITAL_COMMUNITY): Payer: Self-pay

## 2022-05-14 ENCOUNTER — Encounter: Payer: Self-pay | Admitting: Family Medicine

## 2022-05-16 ENCOUNTER — Other Ambulatory Visit (HOSPITAL_BASED_OUTPATIENT_CLINIC_OR_DEPARTMENT_OTHER): Payer: Self-pay

## 2022-06-09 ENCOUNTER — Encounter: Payer: Self-pay | Admitting: Family Medicine

## 2022-06-10 ENCOUNTER — Other Ambulatory Visit: Payer: Self-pay | Admitting: Family Medicine

## 2022-06-10 ENCOUNTER — Other Ambulatory Visit (HOSPITAL_COMMUNITY): Payer: Self-pay

## 2022-06-10 ENCOUNTER — Other Ambulatory Visit (HOSPITAL_BASED_OUTPATIENT_CLINIC_OR_DEPARTMENT_OTHER): Payer: Self-pay

## 2022-06-10 MED ORDER — WEGOVY 0.5 MG/0.5ML ~~LOC~~ SOAJ: 0.5000 mg | SUBCUTANEOUS | 0 refills | Status: DC

## 2022-07-10 ENCOUNTER — Other Ambulatory Visit (HOSPITAL_BASED_OUTPATIENT_CLINIC_OR_DEPARTMENT_OTHER): Payer: Self-pay

## 2022-07-17 ENCOUNTER — Other Ambulatory Visit (HOSPITAL_COMMUNITY): Payer: Self-pay

## 2022-08-15 ENCOUNTER — Other Ambulatory Visit (HOSPITAL_COMMUNITY): Payer: Self-pay

## 2022-08-18 ENCOUNTER — Other Ambulatory Visit (HOSPITAL_COMMUNITY): Payer: Self-pay

## 2022-09-29 ENCOUNTER — Ambulatory Visit: Payer: 59 | Admitting: Family Medicine

## 2022-09-30 ENCOUNTER — Ambulatory Visit: Payer: 59 | Admitting: Family Medicine

## 2022-09-30 ENCOUNTER — Encounter: Payer: Self-pay | Admitting: Family Medicine

## 2022-09-30 ENCOUNTER — Other Ambulatory Visit (HOSPITAL_BASED_OUTPATIENT_CLINIC_OR_DEPARTMENT_OTHER): Payer: Self-pay

## 2022-09-30 VITALS — BP 138/80 | HR 76 | Temp 98.9°F | Ht 67.0 in | Wt 174.2 lb

## 2022-09-30 DIAGNOSIS — I10 Essential (primary) hypertension: Secondary | ICD-10-CM | POA: Diagnosis not present

## 2022-09-30 DIAGNOSIS — F411 Generalized anxiety disorder: Secondary | ICD-10-CM | POA: Diagnosis not present

## 2022-09-30 DIAGNOSIS — G8929 Other chronic pain: Secondary | ICD-10-CM

## 2022-09-30 DIAGNOSIS — M25551 Pain in right hip: Secondary | ICD-10-CM

## 2022-09-30 DIAGNOSIS — M25512 Pain in left shoulder: Secondary | ICD-10-CM | POA: Diagnosis not present

## 2022-09-30 MED ORDER — HYDROCHLOROTHIAZIDE 12.5 MG PO TABS
12.5000 mg | ORAL_TABLET | Freq: Every day | ORAL | 5 refills | Status: DC
  Filled 2022-09-30: qty 30, 30d supply, fill #0
  Filled 2022-12-08: qty 30, 30d supply, fill #1
  Filled 2023-02-08: qty 30, 30d supply, fill #2
  Filled 2023-03-23: qty 30, 30d supply, fill #3
  Filled 2023-04-19: qty 30, 30d supply, fill #4
  Filled 2023-07-12: qty 30, 30d supply, fill #5

## 2022-09-30 MED ORDER — SERTRALINE HCL 50 MG PO TABS
50.0000 mg | ORAL_TABLET | Freq: Every day | ORAL | 5 refills | Status: DC
  Filled 2022-09-30: qty 30, 30d supply, fill #0

## 2022-09-30 NOTE — Patient Instructions (Signed)
Heat (pad or rice pillow in microwave) over affected area, 10-15 minutes twice daily.   Ice/cold pack over area for 10-15 min twice daily.  OK to take Tylenol 1000 mg (2 extra strength tabs) or 975 mg (3 regular strength tabs) every 6 hours as needed.  Send me a message in 1 month if the hip/shoulder are not better.  Let us know if you need anything.  Gluteus Medius Syndrome Rehab It is normal to feel mild stretching, pulling, tightness, or discomfort as you do these exercises, but you should stop right away if you feel sudden pain or your pain gets worse.   Stretching and range of motion exercise This exercise warms up your muscles and joints and improves the movement and flexibility of your hip and pelvis. This exercise also helps to relieve pain and stiffness. Exercise A: Lunge (hip flexor stretch)      Kneel on the floor on your left / right knee. Bend your other knee so it is directly over your ankle. Keep good posture with your head over your shoulders. Tuck your tailbone underneath you. This will prevent your back from arching too much. You should feel a gentle stretch in the front of your thigh or hip. If you do not feel a stretch, slowly lunge forward with your chest up. Hold this position for 30 seconds. Slowly return to the starting position. Repeat 2 times. Complete this exercise 3 times per week. Strengthening exercises These exercises build strength and endurance in your hip and pelvis. Endurance is the ability to use your muscles for a long time, even after they get tired. Exercise B: Bridge (hip extensors)     Lie on your back on a firm surface with your knees bent and your feet flat on the floor. Tighten your buttocks muscles and lift your bottom off the floor until the trunk of your body is level with your thighs. You should feel the muscles working in your buttocks and the back of your thighs. If this exercise is too easy, cross your arms over your chest or lift one  leg while your bottom is up off the floor. Do not arch your back. Hold this position for 3 seconds. Slowly lower your hips to the starting position. Let your muscles relax completely between repetitions. Repeat 2 times. Complete this exercise 3 times per week. Exercise C: Straight leg raises (hip abductors)     Lie on your side with your left / right leg in the top position. Lie so your head, shoulder, knee, and hip line up. Bend your bottom knee to help you balance. Lift your top leg up 4-6 inches (10-15 cm), keeping your toes pointed straight ahead. Hold this position for 2 seconds. Slowly lower your leg to the starting position and let your muscles relax completely. Repeat for a total of 10 repetitions. Repeat 2 times. Complete this exercise 3 times per week. Exercise D: Hip abductors and external rotators, quadruped Get on your hands and knees on a firm, lightly padded surface. Your hands should be directly below your shoulders, and your knees should be directly below your hips. Lift your left / right knee out to the side. Keep your knee bent. Do not twist your body. Hold this position for 3 seconds. Slowly lower your leg. Repeat for a total of 10 repetitions.  Repeat 2 times. Complete this exercise 3 times per week. Exercise E: Single leg stand Stand near a counter or door frame to hold onto as needed.  It is helpful to look in a mirror for this exercise so you can watch your hip. Squeeze your left / right buttock muscles then lift up your other foot. Do not let your left / right hip push out to the side. Hold this position for 3 seconds. Repeat for a total of 10 repetitions. Repeat 2 times. Complete this exercise 3 times per week. Make sure you discuss any questions you have with your health care provider. Document Released: 09/08/2005 Document Revised: 05/15/2016 Document Reviewed: 08/21/2015 Elsevier Interactive Patient Education  2018 [employer].  Biceps Tendon Disruption  (Proximal) Rehab Do exercises exactly as told by your health care provider and adjust them as directed. It is normal to feel mild stretching, pulling, tightness, or discomfort as you do these exercises, but you should stop right away if you feel sudden pain or your pain gets worse.  Stretching and range of motion exercises These exercises warm up your muscles and joints and improve the movement and flexibility of your arm and shoulder. These exercises also help to relieve pain and stiffness. Exercise A: Shoulder flexion, standing   Stand facing a wall. Put your left / right hand on the wall. Slide your left / right hand up the wall. Stop when you feel a stretch in your shoulder, or when you reach the angle recommended by your health care provider. Use your other hand to help raise your arm, if needed. As your hand gets higher, you may need to step closer to the wall. Avoid shrugging your shoulder while you raise your arm. To do this, keep your shoulder blade tucked down toward your spine. Hold for 30 seconds. Slowly return to the starting position. Use your other arm to help, if needed. Repeat 2 times. Complete this exercise 3 times per week. Exercise B: Pendulum   Stand near a wall or a surface that you can hold onto for balance. Bend at the waist and let your left / right arm hang straight down. Use your other arm to support you. Relax your arm and shoulder muscles, and move your hips and your trunk so your left / right arm swings freely. Your arm should swing because of the motion of your body, not because you are using your arm or shoulder muscles. Keep moving so your arm swings in the following directions, as told by your health care provider: Side to side. Forward and backward. In clockwise and counterclockwise circles. Slowly return to the starting position. Repeat 2 times. Complete this exercise 3 times per week.  Strengthening exercises These exercises build strength and endurance  in your arm and shoulder. Endurance is the ability to use your muscles for a long time, even after your muscles get tired. Exercise C: Elbow flexion, neutral  Sit on a stable chair without armrests, or stand. Hold a 3-5 lb weight in your left / right hand, or hold an exercise band with both hands. Your palms should face each other at the starting position. Bend your left / right elbow and move your hand up toward your shoulder. Lead with your thumb, and keep your palm facing the same direction. Keep your other arm straight down, in the starting position. Slowly return to the starting position. Repeat 2-3 times. Complete this exercise 3 times per week. Exercise D: Forearm supination   Sit with your left / right forearm on a table. Your elbow should be below shoulder height. Rest your hand over the edge of the table so your palm faces  down. If directed, hold a hammer with your left / right hand. Without moving your elbow, slowly rotate your hand so your palm faces up toward the ceiling. If you are holding a hammer, begin by holding the hammer near the head. When this exercise gets easier for you, hold the hammer farther down the handle. Hold for 3 seconds. Slowly return to the starting position. Repeat 2 times. Complete this exercise 3 times per week. Exercise E: Scapular retraction   Sit in a stable chair without armrests, or stand. Secure an exercise band to a stable object in front of you so the band is at shoulder height. Hold one end of the exercise band in each hand. Squeeze your shoulder blades together and move your elbows slightly behind you. Do not shrug your shoulders. Hold for 3 seconds. Slowly return to the starting position. Repeat 2 times. Complete this exercise 3 times per week. Exercise F: Scapular protraction, supine   Lie on your back on a firm surface. Hold a 3-5 lb weight in your left / right hand. Raise your left / right arm straight into the air so your hand is  directly above your shoulder joint. Push the weight into the air so your shoulder lifts off of the surface that you are lying on. Do not move your head, neck, or back. Hold for 3 seconds. Slowly return to the starting position. Let your muscles relax completely before you repeat this exercise. Repeat 2 times. Complete this exercise 3 times per week. This information is not intended to replace advice given to you by your health care provider. Make sure you discuss any questions you have with your health care provider. Document Released: 09/08/2005 Document Revised: 05/15/2016 Document Reviewed: 08/17/2015 Elsevier Interactive Patient Education  2017 [employer].

## 2022-09-30 NOTE — Progress Notes (Signed)
Chief Complaint  Patient presents with   Follow-up    6 month     Subjective Rachel Oconnor is a 47 y.o. female who presents for hypertension follow up. She does not monitor home blood pressures. She is compliant with medication- HCTZ 12.5 mg/d. Patient has these side effects of medication: none She is adhering to a healthy diet overall. Current exercise: lifting wts, elliptical machine, walking No CP or SOB.   GAD Taking Zoloft 50 mg/d. Reports compliance, no AE's. No SI or HI. No self medication. Not following w a counselor/therapist.   L shoulder pain 3 mo of L shoulder pain after [employer] press. Nml rom. No bruising, redness, swelling, neuro s/s's. Has not tried much at home. Not getting better.   R hip pain Several yr hx of R hip pain.  She was diagnosed with bursitis in the past and had several injections which helped for around 1 week.  No recent injury or change in activity.   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 Right hip: Normal active/passive range of motion.  TTP over the greater trochanter, no lower back TTP or tenderness over the hip flexor.  Negative Stinchfield, logroll, Ober's, FABER/Faddir. Psych: well oriented with normal range of affect and appropriate  judgment/insight  Essential hypertension  GAD (generalized anxiety disorder) - Plan: sertraline (ZOLOFT) 50 MG tablet  Right hip pain  Chronic left shoulder pain  Chronic, stable.  Continue hydrochlorothiazide 12.5 mg daily.  Counseled on diet and exercise.  She has done an excellent job losing weight. Chronic, stable.  Continue Zoloft 50 mg daily. Stretches and exercises provided for the gluteus medius.  Could consider physical therapy versus another injection.  She would like to hold off on the latter for now. Stretches and exercises provided.  Physical therapy if no better. F/u in 6 months for physical or as needed. The patient voiced understanding and agreement to the plan.  [place], DO 09/30/22  12:28 PM

## 2022-11-15 IMAGING — US US BREAST*R* LIMITED INC AXILLA
1 series · 5 of 5 positions shown · non-contrast
Comparison: Previous exam(s).

CLINICAL DATA: Patient recalled from screening for right breast
asymmetry.

EXAM:
DIGITAL DIAGNOSTIC UNILATERAL RIGHT MAMMOGRAM WITH TOMOSYNTHESIS AND
CAD; ULTRASOUND RIGHT BREAST LIMITED
TECHNIQUE: Right digital diagnostic mammography and breast tomosynthesis was
performed. The images were evaluated with computer-aided detection.;
Targeted ultrasound examination of the right breast was performed

[Series 1: us breast*right* limited inc axilla · 0.07mm/px · 5 of 5 slices shown]
[im 1/5]
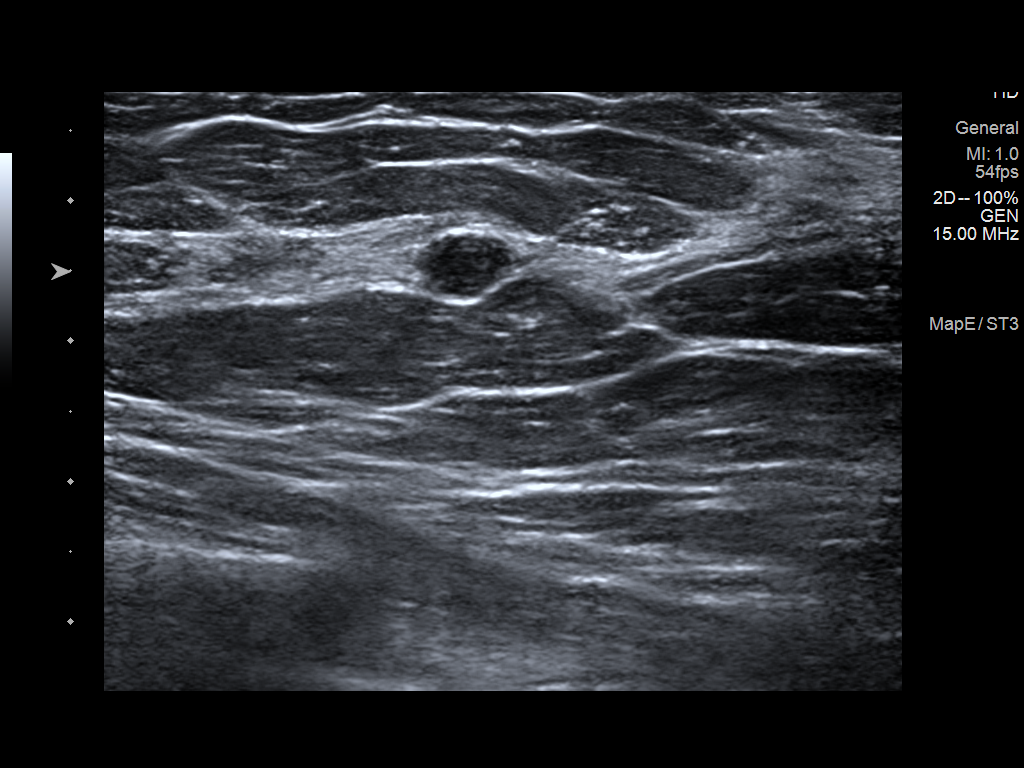
[im 2/5]
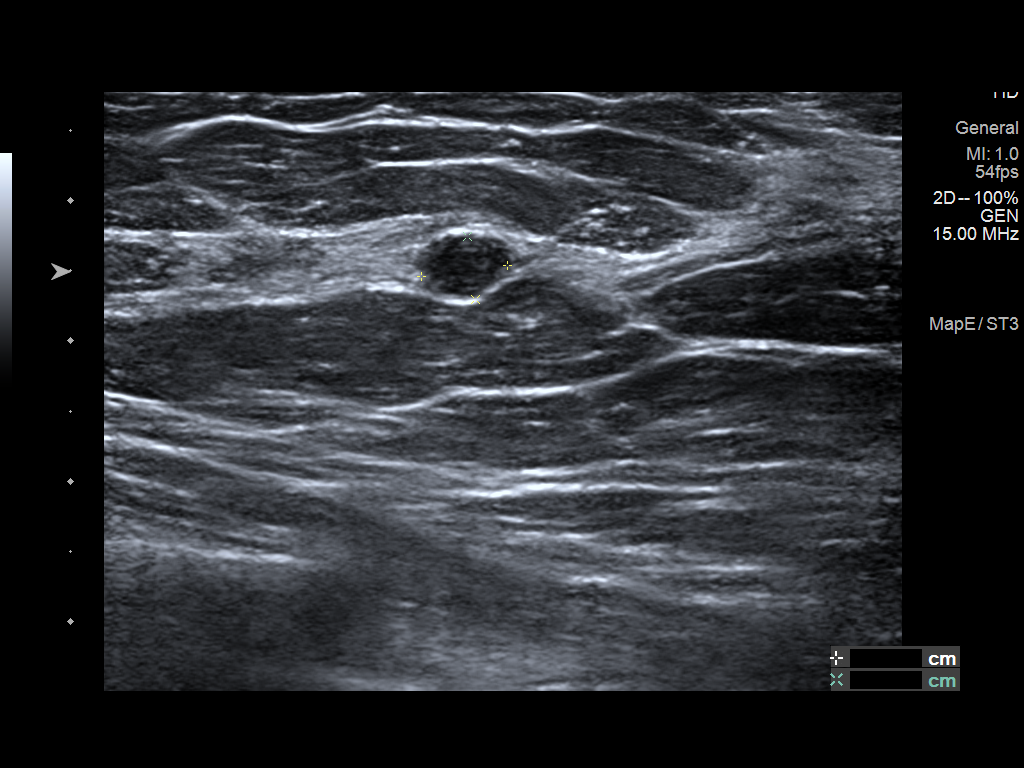
[im 3/5]
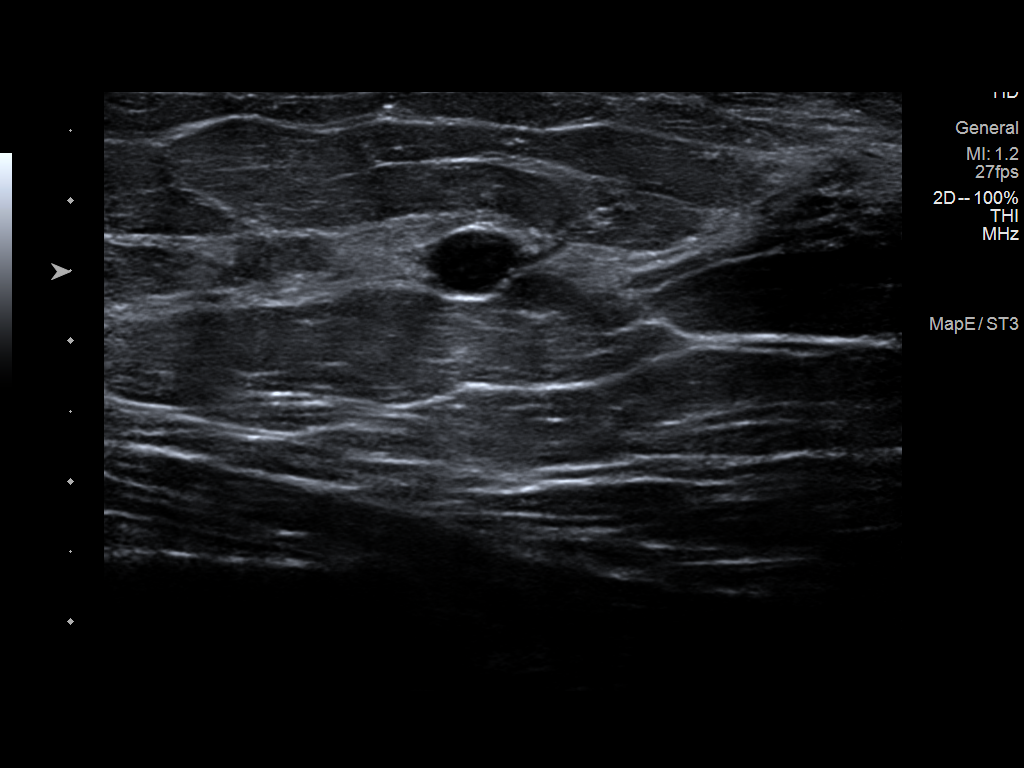
[im 4/5]
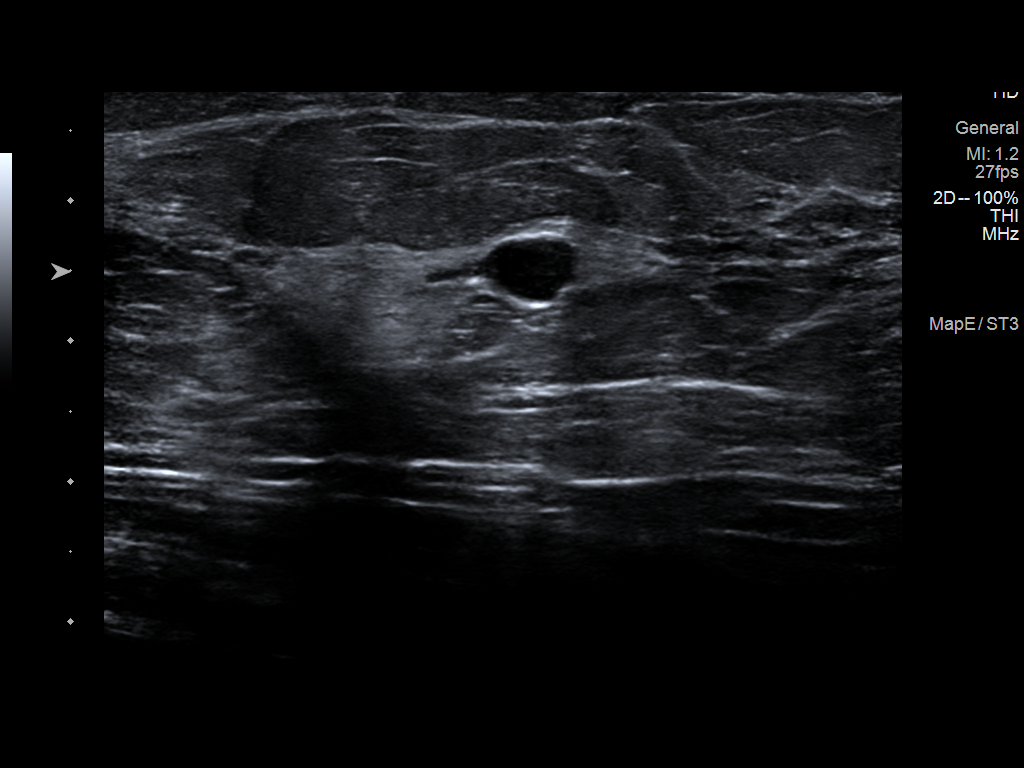
[im 5/5]
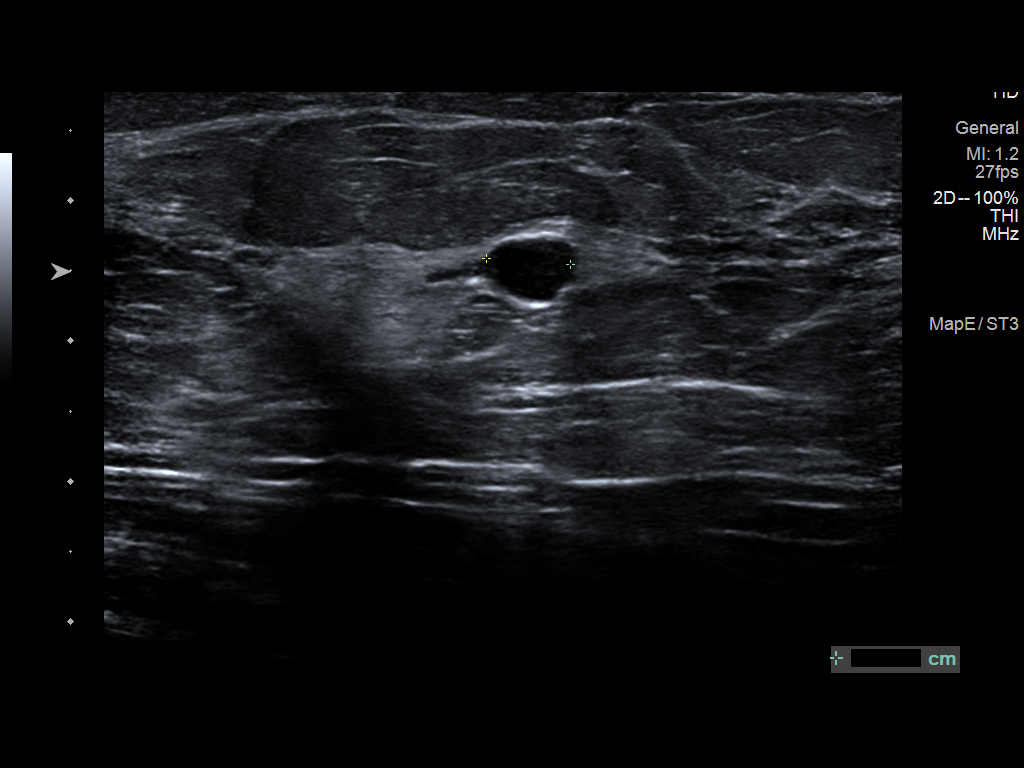

[5 of 5 positions shown; findings below may reference images not displayed]

ACR Breast Density Category c: The breast tissue is heterogeneously
dense, which may obscure small masses.
FINDINGS: Within the superior slightly outer right breast there is a
persistent low-density oval circumscribed mass.

On physical exam, no discrete mass is palpated within the
upper-outer right breast.

Targeted ultrasound is performed, showing a 6 x 5 x 6 mm oval
circumscribed hypoechoic mass favored to represent a complicated
cyst right breast 10 o'clock position 4 cm from nipple. Alternative
consideration would be a small fibroadenoma.
IMPRESSION: Probably benign right breast mass 10 o'clock position.

RECOMMENDATION:
Six-month follow-up right breast ultrasound to reassess the probably
benign mass at the 10 o'clock position.

I have discussed the findings and recommendations with the patient.
If applicable, a reminder letter will be sent to the patient
regarding the next appointment.

BI-RADS CATEGORY  3: Probably benign.

## 2022-11-25 ENCOUNTER — Other Ambulatory Visit: Payer: Self-pay | Admitting: Family Medicine

## 2022-11-25 DIAGNOSIS — N6310 Unspecified lump in the right breast, unspecified quadrant: Secondary | ICD-10-CM

## 2022-12-10 ENCOUNTER — Other Ambulatory Visit (HOSPITAL_BASED_OUTPATIENT_CLINIC_OR_DEPARTMENT_OTHER): Payer: Self-pay

## 2022-12-10 ENCOUNTER — Telehealth: Payer: Self-pay | Admitting: Family Medicine

## 2022-12-10 DIAGNOSIS — J301 Allergic rhinitis due to pollen: Secondary | ICD-10-CM

## 2022-12-10 MED ORDER — LEVOCETIRIZINE DIHYDROCHLORIDE 5 MG PO TABS
5.0000 mg | ORAL_TABLET | Freq: Every evening | ORAL | 3 refills | Status: DC
  Filled 2022-12-10: qty 30, 30d supply, fill #0
  Filled 2023-04-19: qty 30, 30d supply, fill #1
  Filled 2023-11-23: qty 30, 30d supply, fill #2

## 2022-12-10 MED ORDER — MONTELUKAST SODIUM 10 MG PO TABS
10.0000 mg | ORAL_TABLET | Freq: Every day | ORAL | 3 refills | Status: DC
  Filled 2022-12-10: qty 30, 30d supply, fill #0
  Filled 2023-04-19: qty 30, 30d supply, fill #1
  Filled 2023-11-23: qty 30, 30d supply, fill #2

## 2022-12-10 MED ORDER — FLUTICASONE PROPIONATE 50 MCG/ACT NA SUSP
2.0000 | Freq: Every day | NASAL | 5 refills | Status: DC
  Filled 2022-12-10: qty 16, 30d supply, fill #0
  Filled 2023-04-19: qty 16, 30d supply, fill #1

## 2022-12-10 NOTE — Telephone Encounter (Signed)
Prescription Request  12/10/2022  Is this a "Controlled Substance" medicine? No  LOV: 09/30/2022  What is the name of the medication or equipment?   montelukast (SINGULAIR) 10 MG tablet [id number]   fluticasone (FLONASE ALLERGY RELIEF) 50 MCG/ACT nasal spray [id number]   levocetirizine (XYZAL) 5 MG tablet [id number]   Have you contacted your pharmacy to request a refill? Yes   Which pharmacy would you like this sent to?   [facility] [address], [place] [address] Phone: [phone] Fax: [phone]  Patient notified that their request is being sent to the clinical staff for review and that they should receive a response within 2 business days.   Please advise at Mobile [phone] (mobile)

## 2022-12-10 NOTE — Addendum Note (Signed)
Addended by: Sharon Seller B on: 12/10/2022 04:00 PM   Modules accepted: Orders

## 2022-12-10 NOTE — Telephone Encounter (Signed)
Sent in and patient aware 

## 2022-12-11 ENCOUNTER — Other Ambulatory Visit: Payer: 59

## 2023-01-19 ENCOUNTER — Encounter: Payer: Self-pay | Admitting: Family Medicine

## 2023-01-19 ENCOUNTER — Ambulatory Visit: Payer: 59 | Admitting: Family Medicine

## 2023-01-19 ENCOUNTER — Other Ambulatory Visit (HOSPITAL_BASED_OUTPATIENT_CLINIC_OR_DEPARTMENT_OTHER): Payer: Self-pay

## 2023-01-19 VITALS — BP 122/78 | HR 64 | Temp 98.7°F | Ht 67.0 in | Wt 186.2 lb

## 2023-01-19 DIAGNOSIS — M25512 Pain in left shoulder: Secondary | ICD-10-CM | POA: Diagnosis not present

## 2023-01-19 DIAGNOSIS — M25551 Pain in right hip: Secondary | ICD-10-CM | POA: Diagnosis not present

## 2023-01-19 DIAGNOSIS — G8929 Other chronic pain: Secondary | ICD-10-CM | POA: Insufficient documentation

## 2023-01-19 MED ORDER — PREDNISONE 20 MG PO TABS
40.0000 mg | ORAL_TABLET | Freq: Every day | ORAL | 0 refills | Status: AC
  Filled 2023-01-19: qty 10, 5d supply, fill #0

## 2023-01-19 NOTE — Patient Instructions (Signed)
If you do not hear anything about your referral in the next 1-2 weeks, call our office and ask for an update.  Heat (pad or rice pillow in microwave) over affected area, 10-15 minutes twice daily.   Ice/cold pack over area for 10-15 min twice daily.  OK to take Tylenol 1000 mg (2 extra strength tabs) or 975 mg (3 regular strength tabs) every 6 hours as needed.   Let us know if you need anything. 

## 2023-01-19 NOTE — Progress Notes (Signed)
Musculoskeletal Exam  Patient: Rachel Oconnor DOB: 09-27-1975  DOS: 01/19/2023  SUBJECTIVE:  Chief Complaint:   Chief Complaint  Patient presents with   Hip Pain    Right hip    Shoulder Pain    Left shoulder    Rachel Oconnor is a 47 y.o.  female for evaluation and treatment of L shoulder pain.   Onset:  6 weeks ago. No inj or change in activity.  Location: anterior L shoulder Character:  aching and sharp  Progression of issue:  has worsened Associated symptoms: decreased ROM, weakness 2/2 pain No bruising, swelling, redness Treatment: to date has been rest, home exercises, and topical CBD.   Neurovascular symptoms: no  Several yrs of R hip bursitis. Walking is painful 2/2 this. No catching/locking, bruising, redness, swelling. Had injection sin past (2 in past 9 yrs) which did help for around 3 mo. Last injection was several years ago before the covid pandemic. No recent inj or chang ein activity.    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 No acute distress. Thorax & Lungs: No accessory muscle use Musculoskeletal: L shoulder.   Normal active range of motion: no; slightly decreased forward flexion.   Normal passive range of motion: no; slightly decreased forward flexion Tenderness to palpation: no Deformity: no Ecchymosis: no Tests positive: lift off, Speed's, Neer's Tests negative: Cross over, empty can, cross over R hip: +TTP over R greater troch; neg  Stinchfield, log roll, FABER/FADDIR, Ober's Neurologic: Normal sensory function. No focal deficits noted. DTR's equal and symmetric in UE's. No clonus. Psychiatric: Normal mood. Age appropriate judgment and insight. Alert & oriented x 3.    Assessment:  Chronic left shoulder pain - Plan: Ambulatory referral to Sports Medicine, predniSONE (DELTASONE) 20 MG tablet  Right hip pain - Plan: predniSONE (DELTASONE) 20 MG tablet  Plan: Chronic, uncontrolled. Refer sports med. 5 d pred burst 40 mg/d. Stretches/exercises, heat, ice, Tylenol.  F/u as originally scheduled. The patient voiced understanding and agreement to the plan.   [name], DO 01/19/23  4:56 PM

## 2023-01-28 ENCOUNTER — Ambulatory Visit
Admission: RE | Admit: 2023-01-28 | Discharge: 2023-01-28 | Disposition: A | Discharge status: Home / Self Care | Payer: 59 | Source: Ambulatory Visit | Attending: Family Medicine | Admitting: Family Medicine

## 2023-01-28 ENCOUNTER — Other Ambulatory Visit: Payer: Self-pay | Admitting: Family Medicine

## 2023-01-28 DIAGNOSIS — N6310 Unspecified lump in the right breast, unspecified quadrant: Secondary | ICD-10-CM

## 2023-01-30 ENCOUNTER — Encounter: Payer: 59 | Admitting: Sports Medicine

## 2023-01-30 NOTE — Progress Notes (Unsigned)
    Aleen [facility]ells [facility] [address] [place] [address] Phone: [phone]   Assessment and Plan:     There are no diagnoses linked to this encounter.  ***   Pertinent previous records reviewed include ***   Follow Up: ***     Subjective:   I, Christiann Hagerty, am serving as a Neurosurgeon for Doctor Richardean Sale  Chief Complaint: low back pain   HPI:   01/30/23 Patient is  47 year old female complaining of low back pain. Patient states  Relevant Historical Information: ***  Additional pertinent review of systems negative.   Current Outpatient Medications:    fluticasone (FLONASE ALLERGY RELIEF) 50 MCG/ACT nasal spray, Place 2 sprays into both nostrils daily., Disp: 16 g, Rfl: 5   hydrochlorothiazide (HYDRODIURIL) 12.5 MG tablet, Take 1 tablet (12.5 mg total) by mouth daily., Disp: 30 tablet, Rfl: 5   levocetirizine (XYZAL) 5 MG tablet, Take 1 tablet (5 mg total) by mouth every evening., Disp: 30 tablet, Rfl: 3   montelukast (SINGULAIR) 10 MG tablet, Take 1 tablet (10 mg total) by mouth at bedtime., Disp: 30 tablet, Rfl: 3   sertraline (ZOLOFT) 50 MG tablet, Take 1 tablet (50 mg total) by mouth daily., Disp: 30 tablet, Rfl: 5   Turmeric POWD, Take 1 Scoop by mouth 3 (three) times a week. Mix with hot tea and apple cider vinegar, Disp: , Rfl:    Objective:     There were no vitals filed for this visit.    There is no height or weight on file to calculate BMI.    Physical Exam:    ***   Electronically signed by:  Aleen [facility]ells [facility] 7:57 AM 01/30/23

## 2023-02-02 NOTE — Progress Notes (Signed)
This encounter was created in error - please disregard.

## 2023-02-02 NOTE — Progress Notes (Signed)
Aleen [facility]ells [facility] [address] [place] [address] Phone: [phone]   Assessment and Plan:     1. Right hip pain 2. Greater trochanteric bursitis of right hip 3. Chronic left shoulder pain 4. Tendinitis of left rotator cuff 5. Chronic bilateral low back pain without sciatica -Chronic with exacerbation, initial sports medicine visit - Patient presents with multiple musculoskeletal complaints most consistent with right greater trochanteric bursitis, left rotator cuff tendinopathy, chronic lumbar muscular dysfunction - Start meloxicam 15 mg daily x2 weeks.  If still having pain after 2 weeks, complete 3rd-week of meloxicam. May use remaining meloxicam as needed once daily for pain control.  Do not to use additional NSAIDs while taking meloxicam.  May use Tylenol [phone] mg 2 to 3 times a day for breakthrough pain. - Start HEP and physical therapy for low back, hip, shoulder - X-rays obtained in clinic.  My interpretation: No acute fracture, vertebral collapse, dislocation.  Mild glenoid spurring, and loss of typical lumbar lordosis.  Other orders - meloxicam (MOBIC) 15 MG tablet; Take 1 tablet (15 mg total) by mouth daily.    Pertinent previous records reviewed include none   Follow Up: 4 weeks for reevaluation.  Would further address remaining areas of pain and could consider CSI versus OMT based on presentation   Subjective:   I, [name], am serving as a Neurosurgeon for Doctor Richardean Sale  Chief Complaint: low back pain   HPI:   02/03/2023 Patient is a 47 year old female complaining of low back pain. 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 No inj or change in activity.   Location: anterior L shoulder Character:  aching and sharp  Progression of issue:  has worsened Associated symptoms: decreased ROM, weakness 2/2 pain No bruising, swelling, redness Treatment: to date has been rest, home exercises, and topical CBD.   Neurovascular symptoms: no   Several yrs of R hip bursitis. Walking is painful 2/2 this. No catching/locking, bruising, redness, swelling. Had injection sin past (2 in past 9 yrs) which did help for around 3 mo. Last injection was several years ago before the covid pandemic. No recent inj or chang ein activity.    Relevant Historical Information: Hypertension  Additional pertinent review of systems negative.   Current Outpatient Medications:    fluticasone (FLONASE ALLERGY RELIEF) 50 MCG/ACT nasal spray, Place 2 sprays into both nostrils daily., Disp: 16 g, Rfl: 5   hydrochlorothiazide (HYDRODIURIL) 12.5 MG tablet, Take 1 tablet (12.5 mg total) by mouth daily., Disp: 30 tablet, Rfl: 5   levocetirizine (XYZAL) 5 MG tablet, Take 1 tablet (5 mg total) by mouth every evening., Disp: 30 tablet, Rfl: 3   meloxicam (MOBIC) 15 MG tablet, Take 1 tablet (15 mg total) by mouth daily., Disp: 30 tablet, Rfl: 0   montelukast (SINGULAIR) 10 MG tablet, Take 1 tablet (10 mg total) by mouth at bedtime., Disp: 30 tablet, Rfl: 3   sertraline (ZOLOFT) 50 MG tablet, Take 1 tablet (50 mg total) by mouth daily., Disp: 30 tablet, Rfl: 5   Turmeric POWD, Take 1 Scoop by mouth 3 (three) times a week. Mix with hot tea and apple cider vinegar,  Disp: , Rfl:    Objective:     Vitals:   02/03/23 1438  BP: 134/80  Pulse: 68  SpO2: 98%  Weight: 186 lb (84.4 kg)  Height: 5\' 7"  (1.702 m)      Body mass index is 29.13 kg/m.    Physical Exam:    Gen: Appears well, nad, nontoxic and pleasant Psych: Alert and oriented, appropriate mood and affect Neuro: sensation intact, strength is 5/5 in upper and lower extremities, muscle tone wnl Skin: no susupicious lesions or  rashes  Back - Normal skin, Spine with normal alignment and no deformity.   Mild tenderness to vertebral process palpation.   Bilateral lumbar paraspinous muscles are mildly tender and without spasm TTP right greater trochanter NTTP gluteal musculature Straight leg raise negative Negative FADIR, negative FABER  Left shoulder:  No deformity, swelling or muscle wasting No scapular winging FF 180, abd 180, int 0, ext 90 with painful arc NTTP over the [place], clavicle, ac, coracoid, biceps groove, humerus, deltoid, trapezius, cervical spine Mildly pain positive special testing including neer, hawkins, empty can, obriens, crossarm, subscap liftoff, speeds Neg ant drawer, sulcus sign, apprehension Negative Spurling's test bilat FROM of neck   General: awake, alert, and oriented no acute distress, nontoxic Skin: no suspicious lesions or rashes Neuro:sensation intact distally with no deficits, normal muscle tone, no atrophy, strength 5/5 in all tested lower ext groups Psych: normal mood and affect, speech clear     Electronically signed by:  Aleen [facility]ells [facility] 3:42 PM 02/03/23

## 2023-02-03 ENCOUNTER — Ambulatory Visit (INDEPENDENT_AMBULATORY_CARE_PROVIDER_SITE_OTHER): Payer: 59

## 2023-02-03 ENCOUNTER — Ambulatory Visit: Payer: 59 | Admitting: Sports Medicine

## 2023-02-03 ENCOUNTER — Other Ambulatory Visit (HOSPITAL_BASED_OUTPATIENT_CLINIC_OR_DEPARTMENT_OTHER): Payer: Self-pay

## 2023-02-03 VITALS — BP 134/80 | HR 68 | Ht 67.0 in | Wt 186.0 lb

## 2023-02-03 DIAGNOSIS — M25512 Pain in left shoulder: Secondary | ICD-10-CM

## 2023-02-03 DIAGNOSIS — M25551 Pain in right hip: Secondary | ICD-10-CM

## 2023-02-03 DIAGNOSIS — G8929 Other chronic pain: Secondary | ICD-10-CM

## 2023-02-03 DIAGNOSIS — M7582 Other shoulder lesions, left shoulder: Secondary | ICD-10-CM | POA: Diagnosis not present

## 2023-02-03 DIAGNOSIS — M7061 Trochanteric bursitis, right hip: Secondary | ICD-10-CM

## 2023-02-03 DIAGNOSIS — M545 Low back pain, unspecified: Secondary | ICD-10-CM | POA: Diagnosis not present

## 2023-02-03 MED ORDER — MELOXICAM 15 MG PO TABS
15.0000 mg | ORAL_TABLET | Freq: Every day | ORAL | 0 refills | Status: DC
  Filled 2023-02-03: qty 30, 30d supply, fill #0

## 2023-02-03 NOTE — Patient Instructions (Addendum)
Good to see you  - Start meloxicam 15 mg daily x2 weeks.  If still having pain after 2 weeks, complete 3rd-week of meloxicam. May use remaining meloxicam as needed once daily for pain control.  Do not to use additional NSAIDs while taking meloxicam.  May use Tylenol [phone] mg 2 to 3 times a day for breakthrough pain. PT referral HEP  4 week follow up

## 2023-02-05 ENCOUNTER — Encounter: Payer: Self-pay | Admitting: Family Medicine

## 2023-02-26 ENCOUNTER — Ambulatory Visit: Payer: 59

## 2023-03-09 NOTE — Progress Notes (Deleted)
    Aleen [facility]ells [facility] [address] [place] [address] Phone: [phone]   Assessment and Plan:     There are no diagnoses linked to this encounter.  ***   Pertinent previous records reviewed include ***   Follow Up: ***     Subjective:   I, Rachel Oconnor, am serving as a Neurosurgeon for Doctor Richardean Sale   Chief Complaint: low back pain    HPI:    02/03/2023 Patient is a 47 year old female complaining of low back pain. 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 No inj or change in activity.  Location: anterior L shoulder Character:  aching and sharp  Progression of issue:  has worsened Associated symptoms: decreased ROM, weakness 2/2 pain No bruising, swelling, redness Treatment: to date has been rest, home exercises, and topical CBD.   Neurovascular symptoms: no   Several yrs of R hip bursitis. Walking is painful 2/2 this. No catching/locking, bruising, redness, swelling. Had injection sin past (2 in past 9 yrs) which did help for around 3 mo. Last injection was several years ago before the covid pandemic. No recent inj or chang ein activity.    03/10/2023 Patient states    Relevant Historical Information: Hypertension Additional pertinent review of systems negative.   Current Outpatient Medications:    fluticasone (FLONASE ALLERGY RELIEF) 50 MCG/ACT nasal spray, Place 2 sprays into both nostrils daily., Disp: 16 g, Rfl: 5   hydrochlorothiazide (HYDRODIURIL) 12.5 MG tablet, Take 1 tablet (12.5 mg total) by mouth daily., Disp: 30 tablet, Rfl: 5   levocetirizine (XYZAL) 5 MG tablet, Take 1 tablet (5 mg total) by mouth every evening., Disp: 30 tablet, Rfl: 3   meloxicam (MOBIC)  15 MG tablet, Take 1 tablet (15 mg total) by mouth daily., Disp: 30 tablet, Rfl: 0   montelukast (SINGULAIR) 10 MG tablet, Take 1 tablet (10 mg total) by mouth at bedtime., Disp: 30 tablet, Rfl: 3   sertraline (ZOLOFT) 50 MG tablet, Take 1 tablet (50 mg total) by mouth daily., Disp: 30 tablet, Rfl: 5   Turmeric POWD, Take 1 Scoop by mouth 3 (three) times a week. Mix with hot tea and apple cider vinegar, Disp: , Rfl:    Objective:     There were no vitals filed for this visit.    There is no height or weight on file to calculate BMI.    Physical Exam:    ***   Electronically signed by:  Aleen [facility]ells [facility] 10:51 AM 03/09/23

## 2023-03-10 ENCOUNTER — Ambulatory Visit: Payer: 59 | Admitting: Sports Medicine

## 2023-03-11 ENCOUNTER — Ambulatory Visit: Payer: 59

## 2023-03-17 NOTE — Progress Notes (Deleted)
    Rachel Oconnor [facility] [address] [place] [address] Phone: [phone]   Assessment and Plan:     There are no diagnoses linked to this encounter.  ***   Pertinent previous records reviewed include ***   Follow Up: ***     Subjective:   I, Rachel Oconnor, am serving as a Neurosurgeon for Doctor Richardean Sale   Chief Complaint: low back pain    HPI:    02/03/2023 Patient is a 47 year old female complaining of low back pain. 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 No inj or change in activity.  Location: anterior L shoulder Character:  aching and sharp  Progression of issue:  has worsened Associated symptoms: decreased ROM, weakness 2/2 pain No bruising, swelling, redness Treatment: to date has been rest, home exercises, and topical CBD.   Neurovascular symptoms: no   Several yrs of R hip bursitis. Walking is painful 2/2 this. No catching/locking, bruising, redness, swelling. Had injection sin past (2 in past 9 yrs) which did help for around 3 mo. Last injection was several years ago before the covid pandemic. No recent inj or chang ein activity.    03/24/2023 Patient states    Relevant Historical Information: Hypertension  Additional pertinent review of systems negative.   Current Outpatient Medications:    fluticasone (FLONASE ALLERGY RELIEF) 50 MCG/ACT nasal spray, Place 2 sprays into both nostrils daily., Disp: 16 g, Rfl: 5   hydrochlorothiazide (HYDRODIURIL) 12.5 MG tablet, Take 1 tablet (12.5 mg total) by mouth daily., Disp: 30 tablet, Rfl: 5   levocetirizine (XYZAL) 5 MG tablet, Take 1 tablet (5 mg total) by mouth every evening., Disp: 30 tablet, Rfl: 3   meloxicam (MOBIC)  15 MG tablet, Take 1 tablet (15 mg total) by mouth daily., Disp: 30 tablet, Rfl: 0   montelukast (SINGULAIR) 10 MG tablet, Take 1 tablet (10 mg total) by mouth at bedtime., Disp: 30 tablet, Rfl: 3   sertraline (ZOLOFT) 50 MG tablet, Take 1 tablet (50 mg total) by mouth daily., Disp: 30 tablet, Rfl: 5   Turmeric POWD, Take 1 Scoop by mouth 3 (three) times a week. Mix with hot tea and apple cider vinegar, Disp: , Rfl:    Objective:     There were no vitals filed for this visit.    There is no height or weight on file to calculate BMI.    Physical Exam:    ***   Electronically signed by:  Rachel Oconnor [facility] 7:26 AM 03/17/23

## 2023-03-23 ENCOUNTER — Other Ambulatory Visit (HOSPITAL_BASED_OUTPATIENT_CLINIC_OR_DEPARTMENT_OTHER): Payer: Self-pay

## 2023-03-24 ENCOUNTER — Ambulatory Visit: Payer: 59 | Admitting: Sports Medicine

## 2023-03-25 ENCOUNTER — Ambulatory Visit: Payer: 59 | Admitting: Sports Medicine

## 2023-03-25 VITALS — BP 122/78 | HR 73 | Ht 67.0 in | Wt 185.0 lb

## 2023-03-25 DIAGNOSIS — M7061 Trochanteric bursitis, right hip: Secondary | ICD-10-CM

## 2023-03-25 DIAGNOSIS — M25512 Pain in left shoulder: Secondary | ICD-10-CM | POA: Diagnosis not present

## 2023-03-25 DIAGNOSIS — M7582 Other shoulder lesions, left shoulder: Secondary | ICD-10-CM | POA: Diagnosis not present

## 2023-03-25 DIAGNOSIS — G8929 Other chronic pain: Secondary | ICD-10-CM

## 2023-03-25 DIAGNOSIS — M25551 Pain in right hip: Secondary | ICD-10-CM

## 2023-03-25 DIAGNOSIS — M545 Low back pain, unspecified: Secondary | ICD-10-CM

## 2023-03-25 NOTE — Progress Notes (Signed)
Rachel [facility]nor [facility] [address] [place] [address] Phone: [phone]   Assessment and Plan:     1. Right hip pain 2. Greater trochanteric bursitis of right hip -Chronic with exacerbation, subsequent visit - Continued right lateral hip pain most consistent with greater trochanteric bursitis that did not have significant relief with course of meloxicam - Patient elected for greater trochanteric CSI.  Tolerated well per note below - May discontinue meloxicam and use Tylenol for day-to-day pain relief - Continue HEP and start physical therapy.  PT starts next week  Procedure: Greater trochanteric bursal injection Side: Right  Risks explained and consent was given verbally. The site was cleaned with alcohol prep. A steroid injection was performed with patient in the lateral side-lying position at area of maximum tenderness over greater trochanter using 2mL of 1% lidocaine without epinephrine and 1mL of kenalog 40mg /ml. This was well tolerated and resulted in symptomatic relief.  Needle was removed, hemostasis achieved, and post injection instructions were explained.  Pt was advised to call or return to clinic if these symptoms worsen or fail to improve as anticipated.    3. Chronic left shoulder pain 4. Tendinitis of left rotator cuff 5. Chronic bilateral low back pain without sciatica  -Chronic with exacerbation, subsequent visit - Overall significant improvement in other musculoskeletal complaints including left shoulder pain, low back pain after HEP and completing course of meloxicam - Discontinue meloxicam - May use Tylenol as needed for day-to-day pain relief - Continue HEP and start PT planned for next week  Pertinent previous records reviewed include none   Follow Up: 4 to 5 weeks for reevaluation.  Could consider advanced imaging versus CSI based on presentation   Subjective:   I, Rachel Oconnor, am serving as a Neurosurgeon for  Doctor Richardean Sale   Chief Complaint: low back pain    HPI:    02/03/2023 Patient is a 47 year old female complaining of low back pain. 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 No inj or change in activity.  Location: anterior L shoulder Character:  aching and sharp  Progression of issue:  has worsened Associated symptoms: decreased ROM, weakness 2/2 pain No bruising, swelling, redness Treatment: to date has been rest, home exercises, and topical CBD.   Neurovascular symptoms: no   Several yrs of R hip bursitis. Walking is painful 2/2 this. No catching/locking, bruising, redness, swelling. Had injection sin past (2 in past 9 yrs) which did help for around 3 mo. Last injection was several years ago before the covid pandemic. No recent inj or chang ein activity.    03/25/2023 Patient states that she is a lot better. Has increased mileage walking, hip is still flaring     Relevant Historical Information: Hypertension  Additional pertinent review of systems negative.   Current Outpatient Medications:    fluticasone (FLONASE ALLERGY RELIEF) 50 MCG/ACT nasal spray, Place 2 sprays into both nostrils daily., Disp: 16 g, Rfl: 5   hydrochlorothiazide (HYDRODIURIL) 12.5 MG tablet, Take 1 tablet (12.5 mg total) by mouth daily., Disp: 30 tablet, Rfl: 5   levocetirizine (XYZAL) 5 MG tablet, Take 1 tablet (5 mg total) by  mouth every evening., Disp: 30 tablet, Rfl: 3   meloxicam (MOBIC) 15 MG tablet, Take 1 tablet (15 mg total) by mouth daily., Disp: 30 tablet, Rfl: 0   montelukast (SINGULAIR) 10 MG tablet, Take 1 tablet (10 mg total) by mouth at bedtime., Disp: 30 tablet, Rfl: 3   sertraline (ZOLOFT) 50 MG tablet, Take 1 tablet (50 mg total) by mouth  daily., Disp: 30 tablet, Rfl: 5   Turmeric POWD, Take 1 Scoop by mouth 3 (three) times a week. Mix with hot tea and apple cider vinegar, Disp: , Rfl:    Objective:     Vitals:   03/25/23 1356  BP: 122/78  Pulse: 73  SpO2: 99%  Weight: 185 lb (83.9 kg)  Height: 5\' 7"  (1.702 m)      Body mass index is 28.98 kg/m.    Physical Exam:      Gen: Appears well, nad, nontoxic and pleasant Psych: Alert and oriented, appropriate mood and affect Neuro: sensation intact, strength is 5/5 in upper and lower extremities, muscle tone wnl Skin: no susupicious lesions or rashes   Back - Normal skin, Spine with normal alignment and no deformity.   No  tenderness to vertebral process palpation.   Bilateral lumbar paraspinous muscles are non tender and without spasm TTP right greater trochanter NTTP gluteal musculature Straight leg raise negative Negative FADIR, negative FABER    Electronically signed by:  Rachel [facility]nor [facility] 2:09 PM 03/25/23

## 2023-03-25 NOTE — Patient Instructions (Signed)
Discontinue meloxicam and use remainder as needed Tylenol [phone] mg 2-3 times a day for pain relief  Start PT  5 week follow up

## 2023-04-01 ENCOUNTER — Encounter: Payer: Self-pay | Admitting: Family Medicine

## 2023-04-01 ENCOUNTER — Ambulatory Visit (INDEPENDENT_AMBULATORY_CARE_PROVIDER_SITE_OTHER): Payer: 59 | Admitting: Family Medicine

## 2023-04-01 ENCOUNTER — Other Ambulatory Visit: Payer: Self-pay | Admitting: Family Medicine

## 2023-04-01 VITALS — BP 132/76 | HR 62 | Temp 98.1°F | Ht 67.0 in | Wt 188.2 lb

## 2023-04-01 DIAGNOSIS — Z0000 Encounter for general adult medical examination without abnormal findings: Secondary | ICD-10-CM | POA: Diagnosis not present

## 2023-04-01 DIAGNOSIS — R7989 Other specified abnormal findings of blood chemistry: Secondary | ICD-10-CM

## 2023-04-01 DIAGNOSIS — F411 Generalized anxiety disorder: Secondary | ICD-10-CM | POA: Diagnosis not present

## 2023-04-01 DIAGNOSIS — E785 Hyperlipidemia, unspecified: Secondary | ICD-10-CM

## 2023-04-01 LAB — COMPREHENSIVE METABOLIC PANEL
ALT: 12 U/L (ref 0–35)
AST: 12 U/L (ref 0–37)
Albumin: 4.5 g/dL (ref 3.5–5.2)
Alkaline Phosphatase: 46 U/L (ref 39–117)
BUN: 13 mg/dL (ref 6–23)
CO2: 28 mEq/L (ref 19–32)
Calcium: 10.2 mg/dL (ref 8.4–10.5)
Chloride: 101 mEq/L (ref 96–112)
Creatinine, Ser: 0.8 mg/dL (ref 0.40–1.20)
GFR: 87.84 mL/min (ref >60.00)
Glucose, Bld: 83 mg/dL (ref 70–99)
Potassium: 4.7 mEq/L (ref 3.5–5.1)
Sodium: 137 mEq/L (ref 135–145)
Total Bilirubin: 0.8 mg/dL (ref 0.2–1.2)
Total Protein: 7.3 g/dL (ref 6.0–8.3)

## 2023-04-01 LAB — CBC
HCT: 38.6 % (ref 36.0–46.0)
Hemoglobin: 12.7 g/dL (ref 12.0–15.0)
MCHC: 32.8 g/dL (ref 30.0–36.0)
MCV: 94.1 fl (ref 78.0–100.0)
Platelets: 460 10*3/uL — ABNORMAL HIGH (ref 150.0–400.0)
RBC: 4.1 Mil/uL (ref 3.87–5.11)
RDW: 14.8 % (ref 11.5–15.5)
WBC: 5.5 10*3/uL (ref 4.0–10.5)

## 2023-04-01 LAB — LIPID PANEL
Cholesterol: 221 mg/dL — ABNORMAL HIGH (ref 0–200)
HDL: 70.4 mg/dL (ref >39.00)
LDL Cholesterol: 137 mg/dL — ABNORMAL HIGH (ref 0–99)
NonHDL: 150.49
Total CHOL/HDL Ratio: 3
Triglycerides: 68 mg/dL (ref 0.0–149.0)
VLDL: 13.6 mg/dL (ref 0.0–40.0)

## 2023-04-01 LAB — TSH: TSH: 1.82 u[IU]/mL (ref 0.35–5.50)

## 2023-04-01 NOTE — Patient Instructions (Addendum)
Give [place] 2-3 business days to get the results of your labs back.   Keep the diet clean and stay active.  Consider using Aquaphor/Aveeno on your foot as needed.   Follow up in 3-4 weeks if needed for the anxiety if taking the Zoloft daily is not helpful.   Let us know if you need anything.

## 2023-04-01 NOTE — Progress Notes (Signed)
Chief Complaint  Patient presents with   Annual Exam    Tightness in chest Discuss depression, concentration and insomnia     Well Woman Rachel Oconnor is here for a complete physical.   Her last physical was >1 year ago.  Current diet: in general, a "healthy" diet. Current exercise: walking. Weight is stable and she confirms fatigue out of ordinary. Patient's last menstrual period was 11/02/2018. Seatbelt? Yes Advanced directive? 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 Seymour Bars requests two hours needs a bed for overnight stay   WISDOM TOOTH EXTRACTION      Medications  Current Outpatient Medications on File Prior to Visit  Medication Sig Dispense Refill   fluticasone (FLONASE ALLERGY RELIEF) 50 MCG/ACT nasal spray Place 2 sprays into both nostrils daily. 16 g 5   hydrochlorothiazide (HYDRODIURIL) 12.5 MG tablet Take 1 tablet (12.5 mg total) by mouth daily. 30 tablet 5   levocetirizine (XYZAL) 5 MG tablet Take 1 tablet (5 mg total) by mouth every evening. 30 tablet 3   meloxicam (MOBIC) 15 MG tablet Take 1 tablet (15 mg total) by mouth daily. 30 tablet 0    montelukast (SINGULAIR) 10 MG tablet Take 1 tablet (10 mg total) by mouth at bedtime. 30 tablet 3   sertraline (ZOLOFT) 50 MG tablet Take 1 tablet (50 mg total) by mouth daily. 30 tablet 5   Turmeric POWD Take 1 Scoop by mouth 3 (three) times a week. Mix with hot tea and apple cider vinegar      Allergies Allergies  Allergen Reactions   Amlodipine Besylate Swelling   Amoxicillin Rash    Red blotches Did it involve swelling of the face/tongue/throat, SOB, or low BP? No Did it involve sudden or severe rash/hives, skin peeling, or any reaction on the inside of your mouth or nose? No Did you need to seek medical attention at a hospital or doctor's office? No When did it last happen?     20 years ago  If all above answers are "NO", may proceed with cephalosporin use.    Other Other (See Comments)    No blood products    Review of Systems: Constitutional:  no unexpected weight changes Eye:  no recent significant change in vision Ear/Nose/Mouth/Throat:  Ears:  no recent change in hearing Nose/Mouth/Throat:  no complaints of nasal congestion, no sore throat Cardiovascular: no chest pain Respiratory:  no shortness of breath Gastrointestinal:  no abdominal pain, no change in bowel habits GU:  Female: negative for dysuria or pelvic pain Musculoskeletal/Extremities:  no pain of the joints Integumentary (Skin/Breast):  no abnormal skin  lesions reported Neurologic:  no headaches Endocrine:  denies fatigue Hematologic/Lymphatic:  No areas of easy bleeding  Exam BP (!) 140/84 (BP Location: Left Arm, Patient Position: Sitting, Cuff Size: Normal)   Pulse 62   Temp 98.1 F (36.7 C) (Oral)   Ht 5\' 7"  (1.702 m)   Wt 188 lb 4 oz (85.4 kg)   LMP 11/02/2018   SpO2 97%   BMI 29.48 kg/m  General:  well developed, well nourished, in no apparent distress Skin:  no significant moles, warts, or growths Head:  no masses, lesions, or tenderness Eyes:  pupils equal and round, sclera anicteric  without injection Ears:  canals without lesions, TMs shiny without retraction, no obvious effusion, no erythema Nose:  nares patent, mucosa normal, and no drainage Throat/Pharynx:  lips and gingiva without lesion; tongue and uvula midline; non-inflamed pharynx; no exudates or postnasal drainage Neck: neck supple without adenopathy, thyromegaly, or masses Lungs:  clear to auscultation, breath sounds equal bilaterally, no respiratory distress Cardio:  regular rate and rhythm, no LE edema Abdomen:  abdomen soft, nontender; bowel sounds normal; no masses or organomegaly Genital: Defer to GYN Musculoskeletal:  symmetrical muscle groups noted without atrophy or deformity Extremities:  no clubbing, cyanosis, or edema, no deformities, no skin discoloration Neuro:  gait normal; deep tendon reflexes normal and symmetric Psych: well oriented with normal range of affect and appropriate judgment/insight  Assessment and Plan  Well adult exam - Plan: CBC, Comprehensive metabolic panel, Lipid panel   Well 47 y.o. female. Counseled on diet and exercise. GAD: chronic, unstable. Needs to take Zoloft 50 mg/d. Offered prn, politely declined. Counseled on exercise. Has appt w therapist next week. Will f/u with [place] in 3-4 weeks if not improving.  EKG shows NSR, normal axis, no interval abnormalities, no ST segment or T wave changes, good R wave progression.  Other orders as above. Follow up in 6 mo or prn. The patient voiced understanding and agreement to the plan.  Jilda Roche [place], DO 04/01/23 8:23 AM

## 2023-04-15 ENCOUNTER — Other Ambulatory Visit (INDEPENDENT_AMBULATORY_CARE_PROVIDER_SITE_OTHER): Payer: 59

## 2023-04-15 DIAGNOSIS — R7989 Other specified abnormal findings of blood chemistry: Secondary | ICD-10-CM

## 2023-04-15 DIAGNOSIS — E785 Hyperlipidemia, unspecified: Secondary | ICD-10-CM

## 2023-04-15 LAB — LIPID PANEL
Cholesterol: 211 mg/dL — ABNORMAL HIGH (ref 0–200)
HDL: 74 mg/dL (ref >39.00)
LDL Cholesterol: 112 mg/dL — ABNORMAL HIGH (ref 0–99)
NonHDL: 137.25
Total CHOL/HDL Ratio: 3
Triglycerides: 124 mg/dL (ref 0.0–149.0)
VLDL: 24.8 mg/dL (ref 0.0–40.0)

## 2023-04-15 LAB — CBC WITH DIFFERENTIAL/PLATELET
Basophils Relative: 0.8 %
Eosinophils Absolute: 82 cells/uL (ref 15–500)
Lymphs Abs: 1646 cells/uL (ref 850–3900)
MCV: 93.6 fL (ref 80.0–100.0)
MPV: 9.3 fL (ref 7.5–12.5)

## 2023-04-15 NOTE — Addendum Note (Signed)
Addended by: Mervin Kung A on: 04/15/2023 08:17 AM   Modules accepted: Orders

## 2023-04-16 ENCOUNTER — Encounter: Payer: Self-pay | Admitting: Family Medicine

## 2023-04-16 LAB — CBC WITH DIFFERENTIAL/PLATELET
Absolute Monocytes: 494 cells/uL (ref 200–950)
Basophils Absolute: 38 cells/uL (ref 0–200)
Eosinophils Relative: 1.7 %
HCT: 37.9 % (ref 35.0–45.0)
Hemoglobin: 12.5 g/dL (ref 11.7–15.5)
MCH: 30.9 pg (ref 27.0–33.0)
MCHC: 33 g/dL (ref 32.0–36.0)
Monocytes Relative: 10.3 %
Neutro Abs: 2539 cells/uL (ref 1500–7800)
Neutrophils Relative %: 52.9 %
Platelets: 397 10*3/uL (ref 140–400)
RBC: 4.05 10*6/uL (ref 3.80–5.10)
RDW: 13.4 % (ref 11.0–15.0)
Total Lymphocyte: 34.3 %
WBC: 4.8 10*3/uL (ref 3.8–10.8)

## 2023-04-16 LAB — PATHOLOGIST SMEAR REVIEW

## 2023-04-28 ENCOUNTER — Ambulatory Visit: Payer: 59 | Admitting: Sports Medicine

## 2023-08-09 ENCOUNTER — Other Ambulatory Visit: Payer: Self-pay | Admitting: Family Medicine

## 2023-08-10 ENCOUNTER — Other Ambulatory Visit (HOSPITAL_BASED_OUTPATIENT_CLINIC_OR_DEPARTMENT_OTHER): Payer: Self-pay

## 2023-08-10 MED ORDER — HYDROCHLOROTHIAZIDE 12.5 MG PO TABS
12.5000 mg | ORAL_TABLET | Freq: Every day | ORAL | 1 refills | Status: DC
  Filled 2023-08-10: qty 90, 90d supply, fill #0
  Filled 2023-11-23: qty 90, 90d supply, fill #1

## 2023-09-24 ENCOUNTER — Telehealth: Payer: 59 | Admitting: Physician Assistant

## 2023-09-24 ENCOUNTER — Other Ambulatory Visit (HOSPITAL_BASED_OUTPATIENT_CLINIC_OR_DEPARTMENT_OTHER): Payer: Self-pay

## 2023-09-24 DIAGNOSIS — B9689 Other specified bacterial agents as the cause of diseases classified elsewhere: Secondary | ICD-10-CM

## 2023-09-24 DIAGNOSIS — B3731 Acute candidiasis of vulva and vagina: Secondary | ICD-10-CM

## 2023-09-24 DIAGNOSIS — N760 Acute vaginitis: Secondary | ICD-10-CM | POA: Diagnosis not present

## 2023-09-24 MED ORDER — METRONIDAZOLE 500 MG PO TABS
500.0000 mg | ORAL_TABLET | Freq: Two times a day (BID) | ORAL | 0 refills | Status: AC
Start: 2023-09-24 — End: 2023-10-01
  Filled 2023-09-24: qty 14, 7d supply, fill #0

## 2023-09-24 MED ORDER — FLUCONAZOLE 150 MG PO TABS
150.0000 mg | ORAL_TABLET | ORAL | 0 refills | Status: DC | PRN
Start: 2023-09-24 — End: 2024-01-13
  Filled 2023-09-24: qty 2, 6d supply, fill #0

## 2023-09-24 NOTE — Progress Notes (Signed)
 E-Visit for Vaginal Symptoms  We are sorry that you are not feeling well. Here is how we plan to help! Based on what you shared with me it looks like you: May have a vaginosis due to bacteria and a possible yeast infection.  Vaginosis is an inflammation of the vagina that can result in discharge, itching and pain. The cause is usually a change in the normal balance of vaginal bacteria or an infection. Vaginosis can also result from reduced estrogen levels after menopause.  The most common causes of vaginosis are:   Bacterial vaginosis which results from an overgrowth of one on several organisms that are normally present in your vagina.   Yeast infections which are caused by a naturally occurring fungus called candida.   Vaginal atrophy (atrophic vaginosis) which results from the thinning of the vagina from reduced estrogen levels after menopause.   Trichomoniasis which is caused by a parasite and is commonly transmitted by sexual intercourse.  Factors that increase your risk of developing vaginosis include: Medications, such as antibiotics and steroids Uncontrolled diabetes Use of hygiene products such as bubble bath, vaginal spray or vaginal deodorant Douching Wearing damp or tight-fitting clothing Using an intrauterine device (IUD) for birth control Hormonal changes, such as those associated with pregnancy, birth control pills or menopause Sexual activity Having a sexually transmitted infection  Your treatment plan is Metronidazole  or Flagyl  500mg  twice a day for 7 days.  I have electronically sent this prescription into the pharmacy that you have chosen. I have also Prescribed Fluconazole  150mg  Take 1 tablet now and 1 tablet once you complete Metronidazole .   Be sure to take all of the medication as directed. Stop taking any medication if you develop a rash, tongue swelling or shortness of breath. Mothers who are breast feeding should consider pumping and discarding their breast milk  while on these antibiotics. However, there is no consensus that infant exposure at these doses would be harmful.  Remember that medication creams can weaken latex condoms. SABRA   HOME CARE:  Good hygiene may prevent some types of vaginosis from recurring and may relieve some symptoms:  Avoid baths, hot tubs and whirlpool spas. Rinse soap from your outer genital area after a shower, and dry the area well to prevent irritation. Don't use scented or harsh soaps, such as those with deodorant or antibacterial action. Avoid irritants. These include scented tampons and pads. Wipe from front to back after using the toilet. Doing so avoids spreading fecal bacteria to your vagina.  Other things that may help prevent vaginosis include:  Don't douche. Your vagina doesn't require cleansing other than normal bathing. Repetitive douching disrupts the normal organisms that reside in the vagina and can actually increase your risk of vaginal infection. Douching won't clear up a vaginal infection. Use a latex condom. Both female and female latex condoms may help you avoid infections spread by sexual contact. Wear cotton underwear. Also wear pantyhose with a cotton crotch. If you feel comfortable without it, skip wearing underwear to bed. Yeast thrives in [place] [facility] Your symptoms should improve in the next day or two.  GET HELP RIGHT AWAY IF:  You have pain in your lower abdomen ( pelvic area or over your ovaries) You develop nausea or vomiting You develop a fever Your discharge changes or worsens You have persistent pain with intercourse You develop shortness of breath, a rapid pulse, or you faint.  These symptoms could be signs of problems or infections that need to be  evaluated by a medical provider now.  MAKE SURE YOU   Understand these instructions. Will watch your condition. Will get help right away if you are not doing well or get worse.  Thank you for choosing an e-visit.  Your e-visit  answers were reviewed by a board certified advanced clinical practitioner to complete your personal care plan. Depending upon the condition, your plan could have included both over the counter or prescription medications.  Please review your pharmacy choice. Make sure the pharmacy is open so you can pick up prescription now. If there is a problem, you may contact your provider through [employer] and have the prescription routed to another pharmacy.  Your safety is important to us . If you have drug allergies check your prescription carefully.   For the next 24 hours you can use MyChart to ask questions about today's visit, request a non-urgent call back, or ask for a work or school excuse. You will get an email in the next two days asking about your experience. I hope that your e-visit has been valuable and will speed your recovery.   I have spent 5 minutes in review of e-visit questionnaire, review and updating patient chart, medical decision making and response to patient.   Delon CHRISTELLA Dickinson, PA-C

## 2023-10-02 ENCOUNTER — Ambulatory Visit: Payer: 59 | Admitting: Family Medicine

## 2023-10-02 ENCOUNTER — Encounter: Payer: Self-pay | Admitting: Family Medicine

## 2023-10-02 VITALS — BP 130/80 | HR 88 | Temp 98.0°F | Resp 16 | Ht 67.0 in | Wt 198.6 lb

## 2023-10-02 DIAGNOSIS — I10 Essential (primary) hypertension: Secondary | ICD-10-CM

## 2023-10-02 DIAGNOSIS — J301 Allergic rhinitis due to pollen: Secondary | ICD-10-CM

## 2023-10-02 LAB — COMPREHENSIVE METABOLIC PANEL
ALT: 30 U/L (ref 0–35)
AST: 32 U/L (ref 0–37)
Albumin: 4.7 g/dL (ref 3.5–5.2)
Alkaline Phosphatase: 45 U/L (ref 39–117)
BUN: 14 mg/dL (ref 6–23)
CO2: 27 meq/L (ref 19–32)
Calcium: 10.1 mg/dL (ref 8.4–10.5)
Chloride: 102 meq/L (ref 96–112)
Creatinine, Ser: 0.83 mg/dL (ref 0.40–1.20)
GFR: 83.75 mL/min (ref >60.00)
Glucose, Bld: 77 mg/dL (ref 70–99)
Potassium: 4.4 meq/L (ref 3.5–5.1)
Sodium: 138 meq/L (ref 135–145)
Total Bilirubin: 0.7 mg/dL (ref 0.2–1.2)
Total Protein: 7.4 g/dL (ref 6.0–8.3)

## 2023-10-02 LAB — LIPID PANEL
Cholesterol: 221 mg/dL — ABNORMAL HIGH (ref 0–200)
HDL: 59.7 mg/dL (ref >39.00)
LDL Cholesterol: 146 mg/dL — ABNORMAL HIGH (ref 0–99)
NonHDL: 161.09
Total CHOL/HDL Ratio: 4
Triglycerides: 75 mg/dL (ref 0.0–149.0)
VLDL: 15 mg/dL (ref 0.0–40.0)

## 2023-10-02 NOTE — Patient Instructions (Addendum)
Keep the diet clean and stay active.  Give us 2-3 business days to get the results of your labs back.   Aim to do some physical exertion for 150 minutes per week. This is typically divided into 5 days per week, 30 minutes per day. The activity should be enough to get your heart rate up. Anything is better than nothing if you have time constraints.  Let us know if you need anything.

## 2023-10-02 NOTE — Progress Notes (Signed)
 Chief Complaint  Patient presents with   Follow-up    Follow up    Subjective Rachel Oconnor is a 48 y.o. female who presents for hypertension follow up. She does not monitor home blood pressures. She is compliant with medications. Patient has these side effects of medication: none She is usually adhering to a healthy diet overall. Current exercise: none lately No CP or SOB.   Allergies Uses INCS and Xyzal , sometimes Singulair . Controlled mostly. No AE's. Takes prn. Has been having some runny nose and congestion.   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 Cont hydrochlorothiazide  12.5 mg/d. Counseled on diet and exercise. Chronic, stable. Go back on INCS, add Xyzal  prn. Add Singulair  10 mg/d as needed.  Flu shot politely declined. F/u in 6 months. The patient voiced understanding and agreement to the plan.  Mabel [place], DO 10/02/23  9:48 AM

## 2023-11-23 ENCOUNTER — Other Ambulatory Visit (HOSPITAL_BASED_OUTPATIENT_CLINIC_OR_DEPARTMENT_OTHER): Payer: Self-pay

## 2023-11-24 ENCOUNTER — Other Ambulatory Visit: Payer: Self-pay

## 2023-11-24 ENCOUNTER — Other Ambulatory Visit: Payer: Self-pay | Admitting: Family Medicine

## 2023-11-24 ENCOUNTER — Other Ambulatory Visit (HOSPITAL_BASED_OUTPATIENT_CLINIC_OR_DEPARTMENT_OTHER): Payer: Self-pay

## 2023-11-24 NOTE — Telephone Encounter (Signed)
 Copied from CRM [id number]. Topic: Clinical - Medication Refill >> Nov 24, 2023  3:48 PM Adele Barthel wrote: Most Recent Primary Care Visit:  Provider: Sharlene Dory  Department: [facility]  Visit Type: OFFICE VISIT  Date: 10/02/2023  Medication: phentermine (ADIPEX-P) 37.5 MG tablet Take 1 tablet (37.5 mg total) by mouth daily before breakfast.   Has the patient contacted their pharmacy? No (Agent: If no, request that the patient contact the pharmacy for the refill. If patient does not wish to contact the pharmacy document the reason why and proceed with request.) (Agent: If yes, when and what did the pharmacy advise?)  Is this the correct pharmacy for this prescription? Yes If no, delete pharmacy and type the correct one.  This is the patient's preferred pharmacy:   [facility] [place] [facility] [address] [place] [address] Phone: [phone] Fax: [phone]   Has the prescription been filled recently? No  Is the patient out of the medication? Yes  Has the patient been seen for an appointment in the last year OR does the patient have an upcoming appointment? Yes  Can we respond through MyChart? Yes  Agent: Please be advised that Rx refills may take up to 3 business days. We ask that you follow-up with your pharmacy.

## 2023-11-24 NOTE — Telephone Encounter (Signed)
 Last Fill: Unknown  Last OV: 10/02/23 Next OV: 04/01/24   Routing to provider for review/authorization.

## 2023-11-26 ENCOUNTER — Telehealth: Payer: Self-pay | Admitting: Family Medicine

## 2023-11-26 ENCOUNTER — Other Ambulatory Visit: Payer: Self-pay | Admitting: Family Medicine

## 2023-11-26 NOTE — Telephone Encounter (Signed)
 Copied from CRM [id number]. Topic: Clinical - Medication Question >> Nov 26, 2023  3:18 PM [name] wrote: Reason for CRM: Patient calling in regarding phentermine (ADIPEX-P) 37.5 MG status update. I let patient know that prescription request was denied and that provider will like to see her for an appointment before refill can be fulfilled. Patient is scheduled for tomorrow at 8:45 AM.

## 2023-11-27 ENCOUNTER — Ambulatory Visit (INDEPENDENT_AMBULATORY_CARE_PROVIDER_SITE_OTHER): Admitting: Family Medicine

## 2023-11-27 ENCOUNTER — Encounter: Payer: Self-pay | Admitting: Family Medicine

## 2023-11-27 ENCOUNTER — Other Ambulatory Visit (HOSPITAL_BASED_OUTPATIENT_CLINIC_OR_DEPARTMENT_OTHER): Payer: Self-pay

## 2023-11-27 VITALS — BP 122/84 | HR 65 | Temp 97.8°F | Resp 20 | Ht 67.0 in | Wt 192.8 lb

## 2023-11-27 DIAGNOSIS — E669 Obesity, unspecified: Secondary | ICD-10-CM | POA: Diagnosis not present

## 2023-11-27 DIAGNOSIS — Z6830 Body mass index (BMI) 30.0-30.9, adult: Secondary | ICD-10-CM | POA: Diagnosis not present

## 2023-11-27 MED ORDER — PHENTERMINE HCL 37.5 MG PO TABS
37.5000 mg | ORAL_TABLET | Freq: Every day | ORAL | 0 refills | Status: DC
Start: 2023-11-27 — End: 2024-01-18
  Filled 2023-11-27: qty 30, 30d supply, fill #0

## 2023-11-27 NOTE — Patient Instructions (Signed)
 Keep the diet clean and stay active.  Let us know if you need anything.

## 2023-11-27 NOTE — Progress Notes (Signed)
 Chief Complaint  Patient presents with   Acute Visit    Patient presents today for a medication review.    Subjective: Patient is a 48 y.o. female here for obesity.  Last year the patient was on Wegovy and lost weight.  She had a change in her insurance and it is no longer covered.  She gained some weight back.  Lately, she has been diligent with routine cardio and strength training.  She has also improved her diet.  She is not on any current medication but has tolerated phentermine well in the past.  Is it been over a year since she has been on it and is interested in restarting this.  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 No accessory muscle use Psych: Age appropriate judgment and insight, normal affect and mood  Assessment and Plan: Obesity (BMI 30-39.9) - Plan: phentermine (ADIPEX-P) 37.5 MG tablet  Chronic, not controlled.  Start Adipex 37.5 mg daily.  Counseled on diet and exercise.  Follow-up in 1 month to recheck. The patient voiced understanding and agreement to the plan.  Jilda Roche Amorita, DO 11/27/23  9:49 AM

## 2024-01-07 NOTE — Progress Notes (Signed)
 48 y.o. G0P0000 Married [place] American female here for annual exam.    Patient has not taken her anti HTN for 4 - 5 days.  Forgot to take.  Some headache.   Takes her BP at home.   Took Phentermine  while on vacation to control her appetite.  Always had night sweats.   No hot flashes during the day.   Has chronic back pain and she is planning future breast surgery.   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 Doing 5Ks. Enjoys traveling.  In counseling.    PCP: Jobe Mulder, DO   Patient's last menstrual period was 11/02/2018.           Sexually active: Yes.    The current method of family planning is none.    Menopausal hormone therapy:  n/a Exercising: Yes.     Weight lifting and walking  Smoker:  no  OB History  Gravida Para Term Preterm AB Living  0 0 0 0 0 0  SAB IAB Ectopic Multiple Live Births  0 0 0 0 0     HEALTH MAINTENANCE: Last 2 paps:  03/17/2017 neg.  Hysterectomy in 2020.  Cervix benign.  History of abnormal Pap or positive HPV:  no Mammogram:   01/28/23 Breast density Cat C BIRADS Cat 2 benign  Colonoscopy:  12/31/2021 normal results per patient - Dr. Nickey Barn.   Bone Density:  n/a  Result  n/a   Immunization History  Administered Date(s) Administered   Moderna Sars-Covid-2 Vaccination 11/24/2019, 12/23/2019, 09/17/2020   Td 08/28/2003   Tdap 08/27/2010, 08/01/2012, 12/04/2016, 04/05/2021      reports that she has never smoked. She has never used smokeless tobacco. She reports current alcohol use. She reports that she does not use drugs.  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 Lavoie requests two hours needs a bed for overnight stay   WISDOM TOOTH EXTRACTION      Current Outpatient Medications  Medication Sig Dispense Refill   fluticasone  (FLONASE  ALLERGY RELIEF) 50 MCG/ACT nasal spray Place 2 sprays into both nostrils daily. 16 g 5   hydrochlorothiazide  (HYDRODIURIL ) 12.5 MG tablet Take 1 tablet (12.5 mg total) by mouth daily. 90 tablet 1   ibuprofen  (ADVIL ) 200 MG tablet 1 tablet with food or milk as needed Orally Three times a day     levocetirizine (XYZAL ) 5 MG tablet Take 1 tablet (5 mg total) by mouth every evening. 90 tablet 0   montelukast  (SINGULAIR ) 10 MG tablet Take 1 tablet (10 mg total) by mouth at bedtime. 90 tablet 0   phentermine  (ADIPEX-P ) 37.5 MG tablet Take 1 tablet (37.5 mg total) by  mouth daily before breakfast. 30 tablet 0   Turmeric POWD Take 1 Scoop by mouth 3 (three) times a week. Mix with hot tea and apple cider vinegar     No current facility-administered medications for this visit.    ALLERGIES: Amlodipine  besylate, Lactose intolerance (gi), Amoxicillin, and Other  Family History  Problem Relation Age of Onset   Hypertension Mother    Cancer Father        prostate cancer   Breast cancer Maternal Aunt    Diabetes Maternal Aunt    Breast cancer Paternal Aunt    Cancer Paternal Aunt        colon   Diabetes Paternal Aunt    Hypertension Maternal Grandmother     Review of Systems  All other systems reviewed and are negative.   PHYSICAL EXAM:  BP (!) 150/96 (BP Location: Right Arm, Patient Position: Sitting, Cuff Size:  Normal)   Pulse 64   Ht 5\' 8"  (1.727 m)   Wt 185 lb (83.9 kg)   LMP 11/02/2018   SpO2 100%   BMI 28.13 kg/m     General appearance: alert, cooperative and appears stated age Head: normocephalic, without obvious abnormality, atraumatic Neck: no adenopathy, supple, symmetrical, trachea midline and thyroid  normal to inspection and palpation Lungs: clear to auscultation bilaterally Breasts: normal appearance, no masses or tenderness, No nipple retraction or dimpling, No nipple discharge or bleeding, No axillary adenopathy Heart: regular rate and rhythm Abdomen: soft, non-tender; no masses, no organomegaly Extremities: extremities normal, atraumatic, no cyanosis or edema Skin: skin color, texture, turgor normal. No rashes or lesions Lymph nodes: cervical, supraclavicular, and axillary nodes normal. Neurologic: grossly normal  Pelvic: External genitalia:  no lesions              No abnormal inguinal nodes palpated.              Urethra:  normal appearing urethra with no masses, tenderness or lesions              Bartholins and Skenes: normal                 Vagina: normal appearing vagina with normal color and discharge, no lesions              Cervix: absent              Pap taken: no Bimanual Exam:  Uterus:  absent              Adnexa: no mass, fullness, tenderness              Rectal exam: yes.  Confirms.              Anus:  normal sphincter tone, no lesions  Chaperone was present for exam:   Cottie Diss, CMA  ASSESSMENT: Well woman visit with gynecologic exam. Status post robotic hysterectomy with bilateral salpingectomy in 2020 for fibroids.  Dr. Lavoie.  PHQ-9: 0 FH of paternal family prostate, breast, and colon cancer.  Elevated blood pressure.   PLAN: Mammogram screening discussed. Self breast awareness reviewed. Pap and HRV collected:  no.  Not indicated. Guidelines for Calcium, Vitamin D, regular exercise program including cardiovascular and weight bearing  exercise. Medication refills:  NA Labs with PCP.  Referral for genetics counseling and potential testing.  She will get back on her anti-HTN and check her BP at home.  She will report to her PCP if it remains elevated.  We discussed that Phentermine   can also raise blood pressure.  Follow up:  yearly and prn.

## 2024-01-11 ENCOUNTER — Other Ambulatory Visit (HOSPITAL_BASED_OUTPATIENT_CLINIC_OR_DEPARTMENT_OTHER): Payer: Self-pay

## 2024-01-11 ENCOUNTER — Encounter: Payer: 59 | Admitting: Obstetrics and Gynecology

## 2024-01-11 ENCOUNTER — Other Ambulatory Visit: Payer: Self-pay | Admitting: Family Medicine

## 2024-01-11 DIAGNOSIS — J301 Allergic rhinitis due to pollen: Secondary | ICD-10-CM

## 2024-01-11 MED ORDER — LEVOCETIRIZINE DIHYDROCHLORIDE 5 MG PO TABS
5.0000 mg | ORAL_TABLET | Freq: Every evening | ORAL | 0 refills | Status: AC
Start: 2024-01-11 — End: ?
  Filled 2024-01-11: qty 90, 90d supply, fill #0

## 2024-01-11 MED ORDER — MONTELUKAST SODIUM 10 MG PO TABS
10.0000 mg | ORAL_TABLET | Freq: Every day | ORAL | 0 refills | Status: AC
Start: 2024-01-11 — End: ?
  Filled 2024-01-11: qty 90, 90d supply, fill #0

## 2024-01-13 ENCOUNTER — Ambulatory Visit (INDEPENDENT_AMBULATORY_CARE_PROVIDER_SITE_OTHER): Admitting: Obstetrics and Gynecology

## 2024-01-13 ENCOUNTER — Encounter: Payer: Self-pay | Admitting: Obstetrics and Gynecology

## 2024-01-13 VITALS — BP 150/96 | HR 64 | Ht 68.0 in | Wt 185.0 lb

## 2024-01-13 DIAGNOSIS — Z1331 Encounter for screening for depression: Secondary | ICD-10-CM

## 2024-01-13 DIAGNOSIS — Z01419 Encounter for gynecological examination (general) (routine) without abnormal findings: Secondary | ICD-10-CM

## 2024-01-13 DIAGNOSIS — Z809 Family history of malignant neoplasm, unspecified: Secondary | ICD-10-CM

## 2024-01-13 LAB — HM PAP SMEAR: HM Pap smear: NORMAL

## 2024-01-13 NOTE — Patient Instructions (Signed)

## 2024-01-18 ENCOUNTER — Encounter: Payer: Self-pay | Admitting: Family Medicine

## 2024-01-18 ENCOUNTER — Other Ambulatory Visit (HOSPITAL_BASED_OUTPATIENT_CLINIC_OR_DEPARTMENT_OTHER): Payer: Self-pay

## 2024-01-18 ENCOUNTER — Ambulatory Visit: Admitting: Family Medicine

## 2024-01-18 VITALS — BP 136/80 | HR 73 | Temp 98.0°F | Resp 16 | Ht 68.0 in | Wt 185.8 lb

## 2024-01-18 DIAGNOSIS — E669 Obesity, unspecified: Secondary | ICD-10-CM

## 2024-01-18 MED ORDER — PHENTERMINE HCL 37.5 MG PO TABS
37.5000 mg | ORAL_TABLET | Freq: Every day | ORAL | 0 refills | Status: AC
Start: 2024-01-18 — End: ?
  Filled 2024-01-18: qty 30, 30d supply, fill #0

## 2024-01-18 MED ORDER — PHENTERMINE HCL 37.5 MG PO CAPS
37.5000 mg | ORAL_CAPSULE | ORAL | 0 refills | Status: AC
Start: 2024-02-17 — End: ?
  Filled 2024-02-17: qty 30, 30d supply, fill #0

## 2024-01-18 NOTE — Progress Notes (Signed)
 Chief Complaint  Patient presents with   Follow-up    Follow up    Subjective: Patient is a 48 y.o. female here for f/u.  Patient started on phentermine  37.5 mg daily.  She reports compliance no adverse effects.  Diet is improving.  She is doing Pilates for exercise.  She has lost around 7 pounds.  She did go on vacation and thinks she would have lost more were not for that.   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 No accessory muscle use Psych: Age appropriate judgment and insight, normal affect and mood  Assessment and Plan: Obesity (BMI 30-39.9) - Plan: phentermine  (ADIPEX-P ) 37.5 MG tablet, phentermine  37.5 MG capsule  Chronic, improving. Cont 60 more d of phentermine  37.5 mg/d. Counseled on diet/exercise.  F/u as originally scheduled.  The patient voiced understanding and agreement to the plan.  Shellie Dials [place], DO 01/18/24  9:07 AM

## 2024-01-18 NOTE — Patient Instructions (Signed)
Strong work.  Keep the diet clean and stay active.  Let us know if you need anything.

## 2024-01-19 ENCOUNTER — Other Ambulatory Visit (HOSPITAL_BASED_OUTPATIENT_CLINIC_OR_DEPARTMENT_OTHER): Payer: Self-pay

## 2024-01-19 MED ORDER — IBUPROFEN 800 MG PO TABS
800.0000 mg | ORAL_TABLET | Freq: Three times a day (TID) | ORAL | 1 refills | Status: DC | PRN
  Filled 2024-01-19: qty 30, 10d supply, fill #0

## 2024-02-01 ENCOUNTER — Telehealth: Payer: Self-pay

## 2024-02-01 NOTE — Telephone Encounter (Signed)
 Copied from CRM [id number]. Topic: Clinical - Medication Question >> Feb 01, 2024  2:25 PM Allyne Areola wrote: Reason for CRM: Patient is calling to inform that she is taking levocetirizine (XYZAL ) 5 MG tablet  & montelukast  (SINGULAIR ) 10 MG tablet but is not experiencing any relief. She would like to know if there are any alternative medication or increase in dosage that may help.

## 2024-02-01 NOTE — Telephone Encounter (Signed)
 Has she been using Flonase  as well?

## 2024-02-01 NOTE — Telephone Encounter (Signed)
 Sent Pt mychart message, ask her if she still using Flonase  as well?

## 2024-02-02 ENCOUNTER — Other Ambulatory Visit (HOSPITAL_BASED_OUTPATIENT_CLINIC_OR_DEPARTMENT_OTHER): Payer: Self-pay

## 2024-02-02 ENCOUNTER — Other Ambulatory Visit: Payer: Self-pay

## 2024-02-02 ENCOUNTER — Other Ambulatory Visit: Payer: Self-pay | Admitting: Family Medicine

## 2024-02-02 MED ORDER — PREDNISONE 20 MG PO TABS
40.0000 mg | ORAL_TABLET | Freq: Every day | ORAL | 0 refills | Status: AC
  Filled 2024-02-02: qty 10, 5d supply, fill #0

## 2024-02-02 MED ORDER — FLUTICASONE PROPIONATE 50 MCG/ACT NA SUSP
2.0000 | Freq: Every day | NASAL | 5 refills | Status: AC
  Filled 2024-02-02: qty 16, 30d supply, fill #0
  Filled 2024-06-24: qty 16, 30d supply, fill #1

## 2024-02-16 ENCOUNTER — Other Ambulatory Visit (HOSPITAL_BASED_OUTPATIENT_CLINIC_OR_DEPARTMENT_OTHER): Payer: Self-pay

## 2024-02-16 MED ORDER — CLINDAMYCIN HCL 300 MG PO CAPS
300.0000 mg | ORAL_CAPSULE | Freq: Four times a day (QID) | ORAL | 0 refills | Status: DC
  Filled 2024-02-16: qty 28, 7d supply, fill #0

## 2024-02-17 ENCOUNTER — Other Ambulatory Visit (HOSPITAL_BASED_OUTPATIENT_CLINIC_OR_DEPARTMENT_OTHER): Payer: Self-pay

## 2024-02-18 ENCOUNTER — Other Ambulatory Visit (HOSPITAL_BASED_OUTPATIENT_CLINIC_OR_DEPARTMENT_OTHER): Payer: Self-pay

## 2024-03-14 ENCOUNTER — Inpatient Hospital Stay: Attending: Family Medicine | Admitting: Genetic Counselor

## 2024-03-14 ENCOUNTER — Encounter: Payer: Self-pay | Admitting: Genetic Counselor

## 2024-03-14 ENCOUNTER — Inpatient Hospital Stay

## 2024-03-14 DIAGNOSIS — Z8041 Family history of malignant neoplasm of ovary: Secondary | ICD-10-CM | POA: Diagnosis not present

## 2024-03-14 DIAGNOSIS — Z803 Family history of malignant neoplasm of breast: Secondary | ICD-10-CM | POA: Diagnosis not present

## 2024-03-14 DIAGNOSIS — Z8042 Family history of malignant neoplasm of prostate: Secondary | ICD-10-CM

## 2024-03-14 DIAGNOSIS — Z800 Family history of malignant neoplasm of digestive organs: Secondary | ICD-10-CM

## 2024-03-14 LAB — GENETIC SCREENING ORDER

## 2024-03-14 NOTE — Progress Notes (Signed)
 REFERRING PROVIDER: Cathlyn JAYSON Nikki Bobie FORBES, MD [address] [place],  [place] [address]  PRIMARY PROVIDER:  Frann Mabel Mt, DO  PRIMARY REASON FOR VISIT:  1. Family history of malignant neoplasm of breast   2. Family history of malignant neoplasm of ovary   3. Family history of malignant neoplasm of gastrointestinal tract   4. Family history of malignant neoplasm of prostate      HISTORY OF PRESENT ILLNESS:   Rachel Oconnor, a 48 y.o. female, was seen for a [place] cancer genetics consultation at the request of Dr. Cathlyn JAYSON Nikki due to a family history of cancer.  Rachel Oconnor presents to clinic today to discuss the possibility of a hereditary predisposition to cancer, to discuss genetic testing, and to further clarify her future cancer risks, as well as potential cancer risks for family members.   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 She reports on MRI last year, no adenoma was identified. Follow up imaging recommended in a few years. Rachel Oconnor does not report any additional personal history of tumors or cancers.    RELEVANT MEDICAL HISTORY:  Menarche was at age 43.  Nulliparous.  OCP use for approximately 2 years.  Ovaries intact: yes.  Uterus intact: no. Hysterectomy and bilateral salpingectomy in 2020 due to fibroids.  Menopausal status: premenopausal.  HRT use: 0 years. Colonoscopy: yes; 2023, reports three benign polyps. Three year f/u recommended. Mammogram within the last year: no. Last completed 01/28/23, density category C. Reports often needing f/u imaging with ultrasound due to dense, cystic tissue.  Number of breast biopsies: 0.   Past Medical History:  Diagnosis Date   Allergy    Depression    Fibroids 2013   Hypertension    no meds..pt states up last year   Refusal of blood transfusions as patient is Jehovah's Witness 2013   Sleep disorder breathing    SLEEP ARRYTHMIA WEARDS  MOUTH GUARD    Past Surgical History:  Procedure Laterality Date   ABLATION     UTERUS   davinci myomectomy  04/2011   HERNIA REPAIR     INGUINAL   MYOMECTOMY     ROBOTIC ASSISTED LAPAROSCOPIC HYSTERECTOMY AND SALPINGECTOMY Bilateral 11/16/2018   Procedure: XI ROBOTIC ASSISTED TOTAL LAPAROSCOPIC HYSTERECTOMY AND SALPINGECTOMY;  Surgeon: Lavoie, Marie-Lyne, MD;  Location: [facility];  Service: Gynecology;  Laterality: Bilateral;  request to follow first case in block [facility]/Dr. Lavoie requests two hours needs a bed for overnight stay   WISDOM TOOTH EXTRACTION      Social History   Socioeconomic History   Marital status: Married    Spouse name: Not on file   Number of children: Not on file   Years of education: Not on file   Highest education level: Bachelor's degree (e.g., BA, AB, BS)  Occupational History   Not on file  Tobacco Use   Smoking status: Never   Smokeless tobacco: Never  Vaping Use   Vaping status: Never Used  Substance and Sexual Activity   Alcohol use: Yes    Comment: wine WEEKENDS   Drug use: No   Sexual activity: Yes    Partners: Male    Comment: 1st intercourse- 53, partners- 5  Other Topics Concern   Not on file  Social History Narrative   Not on file   Social Drivers of Health   Financial Resource Strain: Low Risk  (  10/01/2023)   Overall Financial Resource Strain (CARDIA)    Difficulty of Paying Living Expenses: Not very hard  Food Insecurity: No Food Insecurity (10/01/2023)   Hunger Vital Sign    Worried About Running Out of Food in the Last Year: Never true    Ran Out of Food in the Last Year: Never true  Transportation Needs: No Transportation Needs (10/01/2023)   PRAPARE - Administrator, Civil Service (Medical): No    Lack of Transportation (Non-Medical): No  Physical Activity: Insufficiently Active (10/01/2023)   Exercise Vital Sign    Days of Exercise per Week: 2 days    Minutes of Exercise per Session:  20 min  Stress: Stress Concern Present (10/01/2023)   [employer] of Occupational Health - Occupational Stress Questionnaire    Feeling of Stress : Rather much  Social Connections: Socially Integrated (10/01/2023)   Social Connection and Isolation Panel    Frequency of Communication with Friends and Family: More than three times a week    Frequency of Social Gatherings with Friends and Family: Once a week    Attends Religious Services: More than 4 times per year    Active Member of [employer] or Organizations: Yes    Attends Engineer, structural: More than 4 times per year    Marital Status: Married     FAMILY HISTORY:  We obtained a detailed, 4-generation family history.  Significant diagnoses are listed below: Family History  Problem Relation Age of Onset   Hypertension Mother    Prostate cancer Father 46 - 39   Breast cancer Other 64 - 53   Diabetes Other    Breast cancer Paternal Aunt 21   Colon cancer Paternal Uncle 54 - 59   Diabetes Paternal Aunt    Hypertension Maternal Grandmother    Brain cancer Other 12   Colon cancer Maternal Cousin 45 - 39   Prostate cancer Maternal Great-grandfather 96 - 64    Rachel Oconnor is unaware of previous family history of genetic testing for hereditary cancer risks. There is no reported Ashkenazi Jewish ancestry.     GENETIC COUNSELING ASSESSMENT: Rachel Oconnor is a 48 y.o. female with a family history of vsnvrt which is somewhat suggestive of a hereditary predisposition to cancer given the family history of a second degree relative with breast cancer and a third degree relative with ovarian cancer on the paternal side of the family. We, therefore, discussed and recommended the following at today's visit.   DISCUSSION: We discussed that 5 - 10% of cancer is hereditary, with most cases of hereditary breast and ovarian cancer associated with BRCA1/2 pathogenic variants.  There are other genes that can be associated with hereditary breast,  ovarian, or colon cancer syndromes.  We discussed that testing is beneficial for several reasons, including knowing about other cancer risks, identifying potential screening and risk-reduction options that may be appropriate, and to understanding if other family members could be at risk for cancer and allowing them to undergo genetic testing.  We reviewed the characteristics, features and inheritance patterns of hereditary cancer syndromes. We also discussed genetic testing, including the appropriate family members to test, the process of testing, insurance coverage and turn-around-time for results. We discussed the implications of a negative, positive, carrier and/or variant of uncertain significant result. We discussed that negative results would be uninformative given that Rachel Oconnor does not have a personal history of cancer.   Rachel Oconnor was offered a common hereditary cancer panel (  40 genes) and an expanded pan-cancer panel (77 genes). Rachel Oconnor was informed of the benefits and limitations of each panel, including that expanded pan-cancer panels contain several genes that do not have clear management guidelines at this point in time.  We also discussed that as the number of genes included on a panel increases, the chances of variants of uncertain significance increases.  After considering the benefits and limitations of each gene panel, Rachel Oconnor elected to have [facility] CancerNext-Expanded +RNAinsight panel. The CancerNext-Expanded gene panel offered by [facility] and includes sequencing, rearrangement, and RNA analysis for the following 77 genes: AIP, ALK, APC, ATM, AXIN2, BAP1, BARD1, BMPR1A, BRCA1, BRCA2, BRIP1, CDC73, CDH1, CDK4, CDKN1B, CDKN2A, CEBPA, CHEK2, CTNNA1, DDX41, DICER1, EGFR, EPCAM, ETV6, FH, FLCN, GATA2, GREM1, HOXB13, KIT, LZTR1, MAX, MBD4, MEN1, MET, MITF, MLH1, MSH2, MSH3, MSH6, MUTYH, NF1, NF2, NTHL1, PALB2, PDGFRA, PHOX2B, PMS2, POLD1, POLE, POT1, PRKAR1A, PTCH1, PTEN, RAD51C, RAD51D,  RB1, RET, RPS20, RUNX1, SDHA, SDHAF2, SDHB, SDHC, SDHD, SMAD4, SMARCA4, SMARCB1, SMARCE1, STK11, SUFU, TMEM127, TP53, TSC1, TSC2, VHL, WT1.    Based on Rachel Oconnor family history of cancer, she meets medical criteria for genetic testing. Though Rachel Oconnor is not personally affected, there are no affected family members that are willing/able to undergo hereditary cancer testing. Despite that she meets criteria, she may still have an out of pocket cost. We discussed that if her out of pocket cost for testing is over $100, the laboratory should contact them to discuss self-pay prices, patient pay assistance programs, if applicable, and other billing options.  We discussed that some people do not want to undergo genetic testing due to fear of genetic discrimination.  A federal law called the Genetic Information Non-Discrimination Act (GINA) of 2008 helps protect individuals against genetic discrimination based on their genetic test results.  It impacts both health insurance and employment.  With health insurance, it protects against increased premiums, being kicked off insurance or being forced to take a test in order to be insured.  For employment it protects against hiring, firing and promoting decisions based on genetic test results.  GINA does not apply to those in the [employer], those who work for companies with less than 15 employees, and new life insurance or long-term disability insurance policies.  Health status due to a cancer diagnosis is not protected under GINA.  Will run breast cancer risk model if genetic testing is uninformative and discuss with return of results.   PLAN: After considering the risks, benefits, and limitations, Rachel Oconnor provided informed consent to pursue genetic testing and the blood sample was sent to [facility] for analysis of the CancerNext-Expanded +RNAinsight test. Results should be available within approximately 2-3 weeks' time, at which point they will be  disclosed by telephone to Rachel Oconnor, as will any additional recommendations warranted by these results. Rachel Oconnor will receive a summary of her genetic counseling visit and a copy of her results once available. This information will also be available in Epic.   Lastly, we encouraged Rachel Oconnor to remain in contact with cancer genetics annually so that we can continuously update the family history and inform her of any changes in cancer genetics and testing that may be of benefit for this family.   Rachel Oconnor questions were answered to her satisfaction today. Our contact information was provided should additional questions or concerns arise. Thank you for the referral and allowing us  to share in the care of your patient.   Burnard Ogren,  MS, LCGC Licensed, Retail [email or web address] phone: [phone]   50 minutes were spent on the date of the encounter in service to the patient including preparation, face-to-face consultation, documentation and care coordination.  The patient was seen alone.  Drs. Gudena and/or Lanny were available to discuss this case as needed.  _______________________________________________________________________ For Office Staff:  Number of people involved in session: 1 Was an Intern/ student involved with case: no

## 2024-03-23 ENCOUNTER — Other Ambulatory Visit: Payer: Self-pay | Admitting: Family Medicine

## 2024-03-23 ENCOUNTER — Telehealth: Payer: Self-pay | Admitting: Genetic Counselor

## 2024-03-23 DIAGNOSIS — Z1231 Encounter for screening mammogram for malignant neoplasm of breast: Secondary | ICD-10-CM

## 2024-03-23 NOTE — Telephone Encounter (Signed)
 Patient about to go in to appointment, will call back later to discuss results.   Mentioned bill for genetic testing for $850.00. Unclear if from [facility] or inusrance, unclear if bill or EOB. Will follow up with me with more info.

## 2024-03-24 ENCOUNTER — Other Ambulatory Visit (HOSPITAL_BASED_OUTPATIENT_CLINIC_OR_DEPARTMENT_OTHER): Payer: Self-pay

## 2024-03-24 MED ORDER — IBUPROFEN 800 MG PO TABS
800.0000 mg | ORAL_TABLET | Freq: Three times a day (TID) | ORAL | 1 refills | Status: DC | PRN
  Filled 2024-03-24: qty 30, 10d supply, fill #0

## 2024-03-24 MED ORDER — CLINDAMYCIN HCL 300 MG PO CAPS
300.0000 mg | ORAL_CAPSULE | Freq: Four times a day (QID) | ORAL | 0 refills | Status: DC
  Filled 2024-03-24: qty 28, 7d supply, fill #0

## 2024-03-24 MED ORDER — HYDROCODONE-ACETAMINOPHEN 5-325 MG PO TABS
1.0000 | ORAL_TABLET | Freq: Four times a day (QID) | ORAL | 0 refills | Status: DC | PRN
  Filled 2024-03-24: qty 20, 5d supply, fill #0

## 2024-03-28 ENCOUNTER — Ambulatory Visit: Payer: Self-pay | Admitting: Genetic Counselor

## 2024-03-28 DIAGNOSIS — Z1379 Encounter for other screening for genetic and chromosomal anomalies: Secondary | ICD-10-CM | POA: Insufficient documentation

## 2024-03-28 NOTE — Progress Notes (Signed)
 HPI:  Ms. Rachel Oconnor was previously seen in the [facility] due to a family history of cancer and concerns regarding a hereditary predisposition to cancer. Please refer to our prior cancer genetics clinic note for more information regarding our discussion, assessment and recommendations, at the time. Rachel Oconnor recent genetic test results were disclosed to her, as were recommendations warranted by these results. These results and recommendations are discussed in more detail below.  Results disclosed by phone 03/28/24.   FAMILY HISTORY:  We obtained a detailed, 4-generation family history.  Significant diagnoses are listed below: Family History  Problem Relation Age of Onset   Hypertension Mother    Prostate cancer Father 38 - 74   Breast cancer Other 8 - 56   Diabetes Other    Breast cancer Paternal Aunt 1   Colon cancer Paternal Uncle 110 - 59   Diabetes Paternal Aunt    Hypertension Maternal Grandmother    Brain cancer Other 43   Colon cancer Maternal Cousin 23 - 39   Prostate cancer Maternal Great-grandfather 37 - 61    Rachel Oconnor is unaware of previous family history of genetic testing for hereditary cancer risks. There is no reported Ashkenazi Jewish ancestry.      GENETIC TEST RESULTS: Genetic testing reported out on 03/22/24 through the [facility] CancerNext-Expanded +RNAinsight panel found no pathogenic mutations. The CancerNext-Expanded gene panel offered by [facility] and includes sequencing, rearrangement, and RNA analysis for the following 77 genes: AIP, ALK, APC, ATM, AXIN2, BAP1, BARD1, BMPR1A, BRCA1, BRCA2, BRIP1, CDC73, CDH1, CDK4, CDKN1B, CDKN2A, CEBPA, CHEK2, CTNNA1, DDX41, DICER1, EGFR, EPCAM, ETV6, FH, FLCN, GATA2, GREM1, HOXB13, KIT, LZTR1, MAX, MBD4, MEN1, MET, MITF, MLH1, MSH2, MSH3, MSH6, MUTYH, NF1, NF2, NTHL1, PALB2, PDGFRA, PHOX2B, PMS2, POLD1, POLE, POT1, PRKAR1A, PTCH1, PTEN, RAD51C, RAD51D, RB1, RET, RPS20, RUNX1, SDHA, SDHAF2, SDHB, SDHC, SDHD,  SMAD4, SMARCA4, SMARCB1, SMARCE1, STK11, SUFU, TMEM127, TP53, TSC1, TSC2, VHL, WT1. The test report has been scanned into EPIC and is located under the Molecular Pathology section of the Results Review tab.  A portion of the result report is included below for reference.     We discussed with Rachel Oconnor that because current genetic testing is not perfect, it is possible there may be a gene mutation in one of these genes that current testing cannot detect, but that chance is small.  We also discussed, that there could be another gene that has not yet been discovered, or that we have not yet tested, that is responsible for the cancer diagnoses in the family. It is also possible there is a hereditary cause for the cancer in the family that Rachel Oconnor did not inherit and therefore was not identified in her testing.  Therefore, it is important to remain in touch with cancer genetics in the future so that we can continue to offer Ms. Monteleone the most up to date genetic testing.   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  At this time, it is unknown if this variant is associated with increased cancer risk or if this is a normal finding, but most variants such as this get reclassified to being inconsequential. It should not be used to make medical management decisions. With time, we suspect the lab will determine the significance of this variant, if any. If we do learn more about it, we will try to contact [name] to discuss it further. However,  it is important to stay in touch with us  periodically and keep the address and phone number up to date.  ADDITIONAL GENETIC TESTING: We discussed with [name] that her genetic testing was fairly extensive.  If there are genes identified to increase cancer risk that can be analyzed in the future, we would be happy to discuss and coordinate this testing at that time.    CANCER SCREENING  RECOMMENDATIONS: [name] test result is considered negative (normal).  This means that we have not identified a hereditary cause for her family history of cancer at this time. Most cancers happen by chance and this negative test suggests that her family history of cancer may fall into this category.    Possible reasons for Rachel Oconnor negative genetic test include:  1. There may be a gene mutation in one of these genes that current testing methods cannot detect but that chance is small.  2. There could be another gene that has not yet been discovered, or that we have not yet tested, that is responsible for the cancer diagnoses in the family.  3.  There may be no hereditary risk for cancer in the family. The cancers in Rachel Oconnor and/or her family may be sporadic/familial or due to other genetic and environmental factors. 4. It is also possible there is a hereditary cause for the cancer in the family that Rachel Oconnor did not inherit.  Therefore, it is recommended she continue to follow the cancer management and screening guidelines provided by her primary healthcare provider. An individual's cancer risk and medical management are not determined by genetic test results alone. Overall cancer risk assessment incorporates additional factors, including personal medical history, family history, and any available genetic information that may result in a personalized plan for cancer prevention and surveillance  Given Rachel Oconnor family histories, we must interpret these negative results with some caution.  Families with features suggestive of hereditary risk for cancer tend to have multiple family members with cancer, diagnoses in multiple generations and diagnoses before the age of 80. Rachel Oconnor family exhibits some of these features. Thus, this result may simply reflect our current inability to detect all mutations within these genes or there may be a different gene that has not yet been discovered or tested.   An  individual's cancer risk and medical management are not determined by genetic test results alone. Overall cancer risk assessment incorporates additional factors, including personal medical history, family history, and any available genetic information that may result in a personalized plan for cancer prevention and surveillance.  The Tyrer-Cuzick model is one of multiple prediction models developed to estimate an individual's lifetime risk of developing breast cancer. The Tyrer-Cuzick model is endorsed by the [employer] (NCCN). This model includes many risk factors such as family history, endogenous estrogen exposure, and benign breast disease. The calculation is highly-dependent on the accuracy of clinical data provided by the patient and can change over time. The Tyrer-Cuzick model may be repeated to reflect new information in her personal or family history in the future.   Based on Ms. Hodgkin history, a Beatrice Harvey was used to estimate her risk of developing breast cancer. This estimates her lifetime risk of developing breast cancer to be approximately 18.8%. Alisa model calculated 5-year risk at 1%. NCCN recommends consideration of breast MRI screening as an adjunct to mammography for patients at high risk (defined as 20% or greater lifetime risk) and consideration of risk reducing agents when 5  year risk is greater than 1.7%. Please note that a woman's breast cancer risk changes over time. It may increase or decrease based on age and any changes to the personal and/or family medical history. The risks and recommendations listed above apply to this patient at this point in time. In the future, she may or may not be eligible for the same medical management strategies and, in some cases, other medical management strategies may become available to her. If she is interested in an updated breast cancer risk assessment at a later date, she can contact us .  RECOMMENDATIONS FOR FAMILY  MEMBERS:  Individuals in this family might be at some increased risk of developing cancer, over the general population risk, simply due to the family history of cancer.  We recommended women in this family have a yearly mammogram beginning at age 72, or 68 years younger than the earliest onset of cancer, an annual clinical breast exam, and perform monthly breast self-exams. Women in this family should also have a gynecological exam as recommended by their primary provider. All family members should be referred for colonoscopy starting at age 65, or 11 years younger than the earliest onset of cancer.  It is also possible there is a hereditary cause for the cancer in Ms. Espinola family that she did not inherit and therefore was not identified in her.  Based on Ms. Domangue family history, we recommended her relatives diagnosed with cancer have genetic counseling and testing. Ms. Galvis will let us  know if we can be of any assistance in coordinating genetic counseling and/or testing for this family member.   FOLLOW-UP: Lastly, we discussed with Ms. Minnifield that cancer genetics is a rapidly advancing field and it is possible that new genetic tests will be appropriate for her and/or her family members in the future. We encouraged her to remain in contact with cancer genetics on an annual basis so we can update her personal and family histories and let her know of advances in cancer genetics that may benefit this family.   Encouraged Ms. Signer to contact [employer] directly regarding the text she received from billing. Their number is [phone]. [facility] handles all billing for genetic testing directly.   Our contact number was provided. Ms. Duva questions were answered to her satisfaction, and she knows she is welcome to call us  at anytime with additional questions or concerns.   Burnard Ogren, MS, [facility] Licensed, Retail [email or web address] [phone]

## 2024-03-28 NOTE — Telephone Encounter (Signed)
 I spoke to Rachel Oconnor to review results of genetic testing. she had genetic testing with [facility] CancerNext-Expanded +RNAinsight panel. 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 We discussed that we do not use VUS to make management decisions or identify at risk family members. Discussed that we do not know why there is cancer in the family. It could be due to a different gene that we are not testing, or maybe our current technology may not be able to pick something up.  It will be important for her to keep in contact with genetics to keep up with whether additional testing may be needed. We will contact her if new information is learned about the VUS.   Provided with the billing number for [facility] and encouraged her to contact the lab directly to discuss self-pay and financial assistance options. Discussed that despite meeting NCCN criteria, this does not mean that there is no cost for genetic testing. Cost dependent on insurance plan/deductible.   Please see counseling note for further detail on this result.

## 2024-03-29 ENCOUNTER — Ambulatory Visit

## 2024-04-01 ENCOUNTER — Encounter: Payer: 59 | Admitting: Family Medicine

## 2024-06-14 ENCOUNTER — Ambulatory Visit
Admission: RE | Admit: 2024-06-14 | Discharge: 2024-06-14 | Disposition: A | Discharge status: Home / Self Care | Source: Ambulatory Visit | Attending: Family Medicine | Admitting: Family Medicine

## 2024-06-14 DIAGNOSIS — Z1231 Encounter for screening mammogram for malignant neoplasm of breast: Secondary | ICD-10-CM

## 2024-06-24 ENCOUNTER — Other Ambulatory Visit: Payer: Self-pay | Admitting: Family Medicine

## 2024-06-27 ENCOUNTER — Other Ambulatory Visit: Payer: Self-pay

## 2024-06-27 ENCOUNTER — Other Ambulatory Visit (HOSPITAL_BASED_OUTPATIENT_CLINIC_OR_DEPARTMENT_OTHER): Payer: Self-pay

## 2024-06-27 MED ORDER — HYDROCHLOROTHIAZIDE 12.5 MG PO TABS
12.5000 mg | ORAL_TABLET | Freq: Every day | ORAL | 1 refills | Status: AC
  Filled 2024-06-27: qty 90, 90d supply, fill #0
  Filled 2024-10-24: qty 90, 90d supply, fill #1

## 2024-09-09 ENCOUNTER — Ambulatory Visit: Admitting: Family Medicine

## 2024-09-09 ENCOUNTER — Encounter: Payer: Self-pay | Admitting: Family Medicine

## 2024-09-09 ENCOUNTER — Other Ambulatory Visit (HOSPITAL_BASED_OUTPATIENT_CLINIC_OR_DEPARTMENT_OTHER): Payer: Self-pay

## 2024-09-09 VITALS — BP 134/84 | HR 66 | Temp 98.0°F | Resp 16 | Ht 68.0 in | Wt 192.0 lb

## 2024-09-09 DIAGNOSIS — E669 Obesity, unspecified: Secondary | ICD-10-CM

## 2024-09-09 DIAGNOSIS — N3000 Acute cystitis without hematuria: Secondary | ICD-10-CM

## 2024-09-09 LAB — POC URINALSYSI DIPSTICK (AUTOMATED)
Blood, UA: NEGATIVE
Glucose, UA: NEGATIVE
Ketones, UA: NEGATIVE
Leukocytes, UA: NEGATIVE
Nitrite, UA: NEGATIVE
Protein, UA: NEGATIVE
Spec Grav, UA: 1.01
Urobilinogen, UA: 0.2 U/dL
pH, UA: 7.5

## 2024-09-09 MED ORDER — SULFAMETHOXAZOLE-TRIMETHOPRIM 800-160 MG PO TABS
1.0000 | ORAL_TABLET | Freq: Two times a day (BID) | ORAL | 0 refills | Status: AC
  Filled 2024-09-09: qty 6, 3d supply, fill #0

## 2024-09-09 MED ORDER — PHENTERMINE HCL 37.5 MG PO CAPS
37.5000 mg | ORAL_CAPSULE | ORAL | 0 refills | Status: AC
  Filled 2024-09-09: qty 30, 30d supply, fill #0

## 2024-09-09 NOTE — Progress Notes (Signed)
 Chief Complaint  Patient presents with   Dysuria    Dysuria    Rachel Oconnor is a 48 y.o. female here for possible UTI.  Duration: 4 days. Symptoms: Low back pain, urinary frequency and urgency Denies: hematuria, urinary hesitancy, urinary retention, fever, nausea, vomiting, flank pain, vaginal discharge Hx of recurrent UTI? No Denies new sexual partners.  Gaining wt. Hx of HTN on hydrochlorothiazide . Diet could be better. Never took her 3rd mo of phentermine  which she did tolerate well and it helped her lose weight. She is not exercising currently.   Past Medical History:  Diagnosis Date   Allergy    Depression    Fibroids 2013   Hypertension    no meds..pt states up last year   Refusal of blood transfusions as patient is [employer]hov[employer]'s Witness 2013   Sleep disorder breathing    SLEEP ARRYTHMIA WEARDS MOUTH GUARD     BP 134/84 (BP Location: Left Arm, Cuff Size: Normal)   Pulse 66   Temp 98 F (36.7 C) (Oral)   Resp 16   Ht 5' 8 (1.727 m)   Wt 192 lb (87.1 kg)   LMP 11/02/2018   SpO2 96%   BMI 29.19 kg/m  General: Awake, alert, appears stated age Heart: RRR Lungs: CTAB, normal respiratory effort, no accessory muscle usage Abd: BS+, soft, NT, ND, no masses or organomegaly MSK: No CVA tenderness, neg Lloyd's sign Psych: Age appropriate judgment and insight  Acute cystitis without hematuria  Obesity with serious comorbidity, unspecified class, unspecified obesity type  3 d of Bactrim . She is not pregnant. Stay hydrated. Seek immediate care if pt starts to develop fevers, new/worsening symptoms, uncontrollable N/V. Chronic, not controlled. Restart phentermine  37.5 mg/d for 1 mo. Counseled on diet/exercise.  F/u as originally scheduled. The patient voiced understanding and agreement to the plan.  [name], DO 09/09/2024 8:53 AM

## 2024-09-09 NOTE — Patient Instructions (Signed)
 Stay hydrated.   Warning signs/symptoms: Uncontrollable nausea/vomiting, fevers, worsening symptoms despite treatment, confusion.  Give [place] around 2 business days to get culture back to you.  Let us know if you need anything.

## 2024-09-09 NOTE — Addendum Note (Signed)
 Addended by: Jontae Sonier M on: 09/09/2024 09:45 AM   Modules accepted: Orders

## 2024-09-11 LAB — URINE CULTURE
MICRO NUMBER:: 17379048
SPECIMEN QUALITY:: ADEQUATE

## 2024-09-12 ENCOUNTER — Ambulatory Visit: Payer: Self-pay | Admitting: Family Medicine

## 2025-01-13 ENCOUNTER — Encounter: Admitting: Family Medicine

## 2025-01-18 ENCOUNTER — Ambulatory Visit: Admitting: Obstetrics and Gynecology
